# Patient Record
Sex: Male | Born: 1960 | Race: White | Hispanic: No | Marital: Married | Smoking: Never smoker
Health system: Southern US, Community
[De-identification: ages and names within clinical notes are randomized; demographics above are authoritative.]

## PROBLEM LIST (undated history)

## (undated) HISTORY — PX: OTHER SURGICAL HISTORY: SHX169

---

## 2004-06-02 ENCOUNTER — Encounter: Payer: Self-pay | Admitting: Family Medicine

## 2004-06-02 LAB — CONVERTED CEMR LAB
AST: 26 units/L
CO2: 27 meq/L
Chloride: 101 meq/L
Potassium: 4.6 meq/L
Sodium: 136 meq/L

## 2004-06-05 ENCOUNTER — Encounter: Payer: Self-pay | Admitting: Family Medicine

## 2004-06-05 LAB — CONVERTED CEMR LAB
LDL Cholesterol: 92 mg/dL
Triglycerides: 53 mg/dL

## 2006-09-16 LAB — CONVERTED CEMR LAB
ALT: 21 units/L
Albumin: 4.7 g/dL
Alkaline Phosphatase: 68 units/L
CO2: 20 meq/L
Calcium: 9.2 mg/dL
Chloride: 102 meq/L
Cholesterol: 193 mg/dL
Glucose, Bld: 95 mg/dL
Platelets: 194 10*3/uL
Total Bilirubin: 0.7 mg/dL
Total Protein: 7.3 g/dL
Triglycerides: 98 mg/dL
WBC, blood: 5.3 10*3/uL

## 2007-07-16 ENCOUNTER — Ambulatory Visit: Payer: Self-pay | Admitting: Family Medicine

## 2007-07-22 ENCOUNTER — Encounter: Payer: Self-pay | Admitting: Family Medicine

## 2007-07-28 ENCOUNTER — Encounter: Payer: Self-pay | Admitting: Family Medicine

## 2007-09-02 ENCOUNTER — Encounter: Payer: Self-pay | Admitting: Family Medicine

## 2007-09-02 LAB — CONVERTED CEMR LAB
ALT: 18 units/L
AST: 19 units/L
Alkaline Phosphatase: 64 units/L
CO2: 22 meq/L
Calcium: 9 mg/dL
Cholesterol: 175 mg/dL
HCT: 47.2 %
HDL: 62 mg/dL
Platelets: 189 10*3/uL
Sodium: 139 meq/L
TSH: 0.647 microintl units/mL
Total Protein: 6.5 g/dL
WBC, blood: 5.2 10*3/uL

## 2007-12-04 ENCOUNTER — Ambulatory Visit: Payer: Self-pay | Admitting: Family Medicine

## 2007-12-15 ENCOUNTER — Encounter: Payer: Self-pay | Admitting: Family Medicine

## 2008-09-10 ENCOUNTER — Ambulatory Visit: Payer: Self-pay | Admitting: Internal Medicine

## 2008-09-10 LAB — CONVERTED CEMR LAB
Bilirubin Urine: NEGATIVE
Blood in Urine, dipstick: NEGATIVE
Nitrite: NEGATIVE
Protein, U semiquant: NEGATIVE
pH: 6

## 2009-09-20 ENCOUNTER — Encounter: Payer: Self-pay | Admitting: Family Medicine

## 2009-10-26 ENCOUNTER — Encounter: Payer: Self-pay | Admitting: Family Medicine

## 2009-11-10 ENCOUNTER — Ambulatory Visit: Payer: Self-pay | Admitting: Family Medicine

## 2009-12-27 ENCOUNTER — Ambulatory Visit: Payer: Self-pay | Admitting: Family Medicine

## 2009-12-27 ENCOUNTER — Telehealth: Payer: Self-pay | Admitting: Family Medicine

## 2009-12-27 DIAGNOSIS — R5383 Other fatigue: Secondary | ICD-10-CM

## 2009-12-27 DIAGNOSIS — R5381 Other malaise: Secondary | ICD-10-CM

## 2009-12-28 ENCOUNTER — Encounter: Payer: Self-pay | Admitting: Family Medicine

## 2009-12-28 DIAGNOSIS — E291 Testicular hypofunction: Secondary | ICD-10-CM

## 2009-12-28 LAB — CONVERTED CEMR LAB: Sex Hormone Binding: 50 nmol/L (ref 13–71)

## 2009-12-29 ENCOUNTER — Encounter: Payer: Self-pay | Admitting: Family Medicine

## 2010-01-30 ENCOUNTER — Encounter: Payer: Self-pay | Admitting: Family Medicine

## 2010-01-30 LAB — CONVERTED CEMR LAB
AST: 56 units/L — ABNORMAL HIGH (ref 0–37)
Alkaline Phosphatase: 73 units/L (ref 39–117)
Indirect Bilirubin: 0.7 mg/dL (ref 0.0–0.9)
LDL Cholesterol: 71 mg/dL (ref 0–99)
PSA: 0.26 ng/mL (ref 0.10–4.00)
Total Bilirubin: 1 mg/dL (ref 0.3–1.2)
Total CHOL/HDL Ratio: 4.2
Triglycerides: 79 mg/dL (ref <150)
VLDL: 16 mg/dL (ref 0–40)

## 2010-02-01 LAB — CONVERTED CEMR LAB
ALT: 62 units/L — ABNORMAL HIGH (ref 0–53)
AST: 39 units/L — ABNORMAL HIGH (ref 0–37)
Albumin: 4.7 g/dL (ref 3.5–5.2)
Indirect Bilirubin: 0.8 mg/dL (ref 0.0–0.9)
Total Protein: 7.2 g/dL (ref 6.0–8.3)

## 2010-02-03 ENCOUNTER — Encounter
Admission: RE | Admit: 2010-02-03 | Discharge: 2010-02-03 | Payer: Self-pay | Source: Home / Self Care | Attending: Family Medicine | Admitting: Family Medicine

## 2010-02-16 ENCOUNTER — Encounter: Payer: Self-pay | Admitting: Family Medicine

## 2010-03-28 NOTE — Assessment & Plan Note (Signed)
Summary: Sinusitis   Vital Signs:  Patient profile:   50 year old male Height:      67 inches Weight:      158 pounds Temp:     100.4 degrees F oral Pulse rate:   83 / minute BP sitting:   145 / 84  (right arm) Cuff size:   regular  Vitals Entered By: Avon Gully CMA, Duncan Dull) (November 10, 2009 3:28 PM) CC: fever last friday 102, didnt run a fever again  untill today, HA hurts behind the eyes, drainage and scratchy throat   Primary Care Provider:  Seymour Bars DO  CC:  fever last friday 102, didnt run a fever again  untill today, HA hurts behind the eyes, and drainage and scratchy throat.  History of Present Illness: fever last friday 102, didn;t run a fever again  untill today, HA hurts behind the eyes, sinus drainage and scratchy throat.   Started taking a sudafed. More congestion. Sinus pressure.  + HA yesterday. No GI sxs.  No ear pain. Pain in the frontal area and behind the eyes. Also using saline nasal spray.   Current Medications (verified): 1)  None  Allergies (verified): 1)  ! Pcn 2)  ! Sulfa  Comments:  Nurse/Medical Assistant: The patient's medications and allergies were reviewed with the patient and were updated in the Medication and Allergy Lists. 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   Never smoked. 4 ETOH/ week. Works out - cycles, runs 4 x a week.  Good diet (Heart Healthy).  Physical Exam  General:  Well-developed,well-nourished,in no acute distress; alert,appropriate and cooperative throughout examination Head:  Normocephalic and atraumatic without obvious abnormalities. No apparent alopecia or balding. Eyes:  No corneal or conjunctival inflammation noted. EOMI. Perrla.  Ears:  External ear exam shows no significant lesions or deformities.  Otoscopic examination reveals clear canals, tympanic membranes are intact bilaterally without bulging,  retraction, inflammation or discharge. Hearing is grossly normal bilaterally. Nose:  External nasal examination shows no deformity or inflammation. Nasal mucosa are pink and moist without lesions or exudates. Mouth:  Oral mucosa and oropharynx without lesions or exudates.  Teeth in good repair. Neck:  No deformities, masses, or tenderness noted. Lungs:  Normal respiratory effort, chest expands symmetrically. Lungs are clear to auscultation, no crackles or wheezes. Heart:  Normal rate and regular rhythm. S1 and S2 normal without gallop, murmur, click, rub or other extra sounds. Skin:  no rashes.   Cervical Nodes:  No lymphadenopathy noted Psych:  Cognition and judgment appear intact. Alert and cooperative with normal attention span and concentration. No apparent delusions, illusions, hallucinations   Impression & Recommendations:  Problem # 1:  SINUSITIS - ACUTE-NOS (ICD-461.9) Since started with fever and now fever again 6 days later will tx as bacterial sinusitis. Call if not better in one week. STay hdyrated.   Consider flu vac when better since has a fever today. Usually gets through work.  His updated medication list for this problem includes:    Zithromax Z-pak 250 Mg Tabs (Azithromycin) .[name] Kitchen... Take as directed.  Instructed on treatment. Call if symptoms persist or worsen.   Complete Medication List: 1)  Zithromax Z-pak 250 Mg Tabs (Azithromycin) .... Take as directed.  Contraindications/Deferment of Procedures/Staging:    Treatment: Flu Shot    Contraindication: other   Patient Instructions: 1)  Call if not better in one  week.  Prescriptions: ZITHROMAX Z-PAK 250 MG TABS (AZITHROMYCIN) Take as directed.  #1 pack x 0   Entered and Authorized by:   Nani Gasser MD   Signed by:   Nani Gasser MD on 11/10/2009   Method used:   Electronically to        [facility] [phone]* (retail)       [address]       [place], [place]  [address]       Ph: [phone] or  [phone]       Fax: [phone]   RxID:   [id number]

## 2010-03-28 NOTE — Letter (Signed)
Summary: ED Questionnaire  ED Questionnaire   Imported By: Lanelle Bal 01/04/2010 11:03:35  _____________________________________________________________________  External Attachment:    Type:   Image     Comment:   External Document

## 2010-03-28 NOTE — Progress Notes (Signed)
Summary: testerone  ---- Converted from flag ---- ---- 12/27/2009 11:06 AM, Nani Gasser MD wrote: Call pt: Could consider testing testosterone levels if would like. ------------------------------  12/27/09 2:50 acm spoke with pt and pt does want to do the testosterone test  November  1, 20113:12 PM OK, can fax rx downstairs. [place]he[place] MD, [facility]   December 27, 2009 3:23 PM McCrimmon CMA, Duncan Dull), Sue Lush

## 2010-03-28 NOTE — Assessment & Plan Note (Signed)
Summary: Fatigue   Vital Signs:  Patient profile:   50 year old male Height:      67 inches Weight:      157 pounds Temp:     98.2 degrees F oral Pulse rate:   67 / minute BP sitting:   130 / 79  (right arm) Cuff size:   regular  Vitals Entered By: Avon Gully CMA, Duncan Dull) (December 27, 2009 9:23 AM) CC: feeling fatigue,wants flu shot, had physical at work but wants to know if there is anything eles that should be added to bld work   Primary Care Provider:  Seymour Bars DO  CC:  feeling fatigue, wants flu shot, and had physical at work but wants to know if there is anything eles that should be added to bld work.  History of Present Illness: feeling fatigue,wants flu shot, had physical at work but wants to know if there is anything else that should be added to bld work. Does have a new baby at home that is one 31 old and an 41 month old. 1 mo old has been colicky and waking up in the middle of the night. He mostly notices with his cycling dec energy adn stamina. Says he knows age may play a factor as well. Says gets good amt of protein in his diet. 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 Had normal CBC, thyroid, PSA, adn CMP.  LDL was borderline. Normal PFTs.  No other specific sxs.   Current Medications (verified): 1)  None  Allergies (verified): 1)  ! Pcn 2)  ! Sulfa  Comments:  Nurse/Medical Assistant: The patient's medications and allergies were reviewed with the patient and were updated in the Medication and Allergy Lists. 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   Married in 2008 to [place].   Never smoked. 4 ETOH/ week. Works out - cycles, runs 4 x a week.  Good diet (Heart  Healthy).  Physical Exam  General:  Well-developed,well-nourished,in no acute distress; alert,appropriate and cooperative throughout examination Head:  Normocephalic and atraumatic without obvious abnormalities. No apparent alopecia or balding. Neck:  No deformities, masses, or tenderness noted. NO TM.  Lungs:  Normal respiratory effort, chest expands symmetrically. Lungs are clear to auscultation, no crackles or wheezes. Heart:  Normal rate and regular rhythm. S1 and S2 normal without gallop, murmur, click, rub or other extra sounds. Skin:  no rashes.   Cervical Nodes:  No lymphadenopathy noted   Impression & Recommendations:  Problem # 1:  FATIGUE (ICD-780.79) His labs are essentailly normal and suggest that the change he has noticed may be from lack of sleep with the new baby and inc stress levels of now having 2 children in the home.  Could consider testing his testosterone levels.  Recommmend see if he notices improvement over the next 3-4 months as his little ones are hopefull sleeping throught the night. If not then consider further workup.   Patient Instructions: 1)  See if stamina duing workout gets better over the next 3-4 months. If not then let me know.    Orders Added: 1)  Est. Patient Level III [16109]  Appended Document: Fatigue    Clinical Lists Changes  Orders: Added new Service order of Flu Vaccine 85yrs + [phone]) - Signed Added new Service order of Admin 1st Vaccine ([id number]) - Signed Observations: Added new observation of FLU VAX#1VIS: 09/20/09 version given December 27, 2009. (12/27/2009 11:12) Added new observation of FLU VAXLOT: [id number] (12/27/2009 11:12) Added new observation of FLU VAX EXP: 08/26/2010 (12/27/2009 11:12) Added new observation of FLU VAXBY: Andrea McCrimmon CMA, (AAMA) (12/27/2009 11:12) Added new observation of FLU VAXRTE: IM (12/27/2009 11:12) Added new observation of FLU VAX DSE: 0.5 ml (12/27/2009 11:12) Added new observation of FLU  VAXMFR: GlaxoSmithKline (12/27/2009 11:12) Added new observation of FLU VAX SITE: left deltoid (12/27/2009 11:12) Added new observation of FLU VAX: Fluvax 3+ (12/27/2009 11:12)       Immunizations Administered:  Influenza Vaccine # 1:    Vaccine Type: Fluvax 3+    Site: left deltoid    Mfr: GlaxoSmithKline    Dose: 0.5 ml    Route: IM    Given by: Sue Lush McCrimmon CMA, (AAMA)    Exp. Date: 08/26/2010    Lot #: [id number]    VIS given: 09/20/09 version given December 27, 2009.  Flu Vaccine Consent Questions:    Do you have a history of severe allergic reactions to this vaccine? no    Any prior history of allergic reactions to egg and/or gelatin? no    Do you have a sensitivity to the preservative Thimersol? no    Do you have a past history of Guillan-Barre Syndrome? no    Do you currently have an acute febrile illness? no    Have you ever had a severe reaction to latex? no    Vaccine information given and explained to patient? no

## 2010-03-30 NOTE — Consult Note (Signed)
Summary: [facility]   Imported By: Lanelle Bal 03/06/2010 11:37:02  _____________________________________________________________________  External Attachment:    Type:   Image     Comment:   External Document

## 2010-04-10 ENCOUNTER — Ambulatory Visit (INDEPENDENT_AMBULATORY_CARE_PROVIDER_SITE_OTHER): Payer: Commercial Indemnity | Admitting: Family Medicine

## 2010-04-10 ENCOUNTER — Encounter: Payer: Self-pay | Admitting: Family Medicine

## 2010-04-10 DIAGNOSIS — B354 Tinea corporis: Secondary | ICD-10-CM | POA: Insufficient documentation

## 2010-04-11 ENCOUNTER — Encounter: Payer: Self-pay | Admitting: Family Medicine

## 2010-04-19 NOTE — Assessment & Plan Note (Signed)
Summary: RAsh on neck   Vital Signs:  Patient profile:   50 year old male Height:      67 inches Weight:      155 pounds Pulse rate:   89 / minute BP sitting:   136 / 85  (right arm) Cuff size:   regular  Vitals Entered By: Avon Gully CMA, Duncan Dull) (April 10, 2010 8:27 AM) CC: rash on neck,form   Primary Care Provider:  Seymour Bars DO  CC:  rash on neck and form.  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  She is already certified but wants to do some addition training  Also getting small flat dry circular areas on his neck.Had a lesion on the Left forearm as well. He has been using lamisil on it and that has been helping but then will get a new spot. Changed razors adn tried cleaning his helmut well.   Current Medications (verified): 1)  None  Allergies (verified): 1)  ! Pcn 2)  ! Sulfa  Comments:  Nurse/Medical Assistant: The patient's medications and allergies were reviewed with the patient and were updated in the Medication and Allergy Lists. Avon Gully CMA, Duncan Dull) (April 10, 2010 8:28 AM)  Social History: Reviewed history from 11/10/2009 and no changes required. 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   Never smoked. 4 ETOH/ week. Works out - cycles, runs 4 x a week.  Good diet (Heart Healthy).  Physical Exam  General:  Well-developed,well-nourished,in no acute distress; alert,appropriate and cooperative throughout examination Skin:  Neck with 3 small 0.5 cm flat dry scaling pink lesions.     Impression & Recommendations:  Problem # 1:  TINEA CORPORIS (ICD-110.5) Since lamisil is working well discussed using the cream for one more week after the lesions resolves to make sure it treats any spores left on the skin.  Likely this should work. Also clean things that come in contact with the face with a bleach solution to kill the  spores.   Also note form was completed. OK for diving.    Orders Added: 1)  Est. Patient Level II [11914]

## 2010-04-25 ENCOUNTER — Ambulatory Visit (INDEPENDENT_AMBULATORY_CARE_PROVIDER_SITE_OTHER): Payer: Commercial Indemnity | Admitting: Family Medicine

## 2010-04-25 ENCOUNTER — Encounter: Payer: Self-pay | Admitting: Family Medicine

## 2010-04-25 DIAGNOSIS — R0789 Other chest pain: Secondary | ICD-10-CM | POA: Insufficient documentation

## 2010-05-04 NOTE — Assessment & Plan Note (Signed)
Summary: F/U Hospitalization for Atypical CP   Vital Signs:  Patient profile:   50 year old male Height:      67 inches Weight:      154 pounds Pulse rate:   69 / minute BP sitting:   121 / 77  (right arm) Cuff size:   regular  Vitals Entered By: Avon Gully CMA, Duncan Dull) (April 25, 2010 8:55 AM) CC: hosptital f/u chest pain   Primary Care Provider:  Seymour Bars DO  CC:  hosptital f/u chest pain.  History of Present Illness: Presented to the ED 2-13 with CP. Was driving home from dinner with his wife. Squeezing pain with pressure.  No chest wall discomfort. Started after a "fullness" sensation. CP was in the center of chest. Normal tropinins, normal EKG.  Had normal stress test and echo the following day. CP resolved after about 20-30 minutes. They felt not heart related.  No f/u with cardiology. Back at work and no more episodes of CP.  No family hx of heart dz.   Current Medications (verified): 1)  None  Allergies (verified): 1)  ! Pcn 2)  ! Sulfa  Comments:  Nurse/Medical Assistant: The patient's medications and allergies were reviewed with the patient and were updated in the Medication and Allergy Lists. Avon Gully CMA, Duncan Dull) (April 25, 2010 8:58 AM)  Past History:  Past Medical History: Headaches  in childhood, last headache 2000; no definite diagnsis of migraine Chest wall pain , negative Stress Cardiolite 2006 Neg stress test in 03/2010  Physical Exam  General:  Well-developed,well-nourished,in no acute distress; alert,appropriate and cooperative throughout examination Neck:  No deformities, masses, or tenderness noted. Lungs:  Normal respiratory effort, chest expands symmetrically. Lungs are clear to auscultation, no crackles or wheezes. Heart:  Normal rate and regular rhythm. S1 and S2 normal without gallop, murmur, click, rub or other extra sounds. NO carotid or abdominal bruits.   Abdomen:  Bowel sounds positive,abdomen soft and non-tender  without masses, organomegaly or hernias noted. Pulses:  Radila 2+ bilat.  Extremities:  No LE edema.    Impression & Recommendations:  Problem # 1:  CHEST PAIN, ATYPICAL (ICD-786.59) Discussed that he is doing well and had a normal stress test. Cholesterol is well controlled. He is not a smoker and no premature family hx of heart dz. He is low risk.  If has any more sxs then please call and will work up further for GI sxs.    Patient Instructions: 1)  Please call us if start having chest pain.    Orders Added: 1)  Est. Patient Level III [16109]

## 2010-05-04 NOTE — Letter (Signed)
Summary: [facility]   Imported By: Lanelle Bal 04/27/2010 13:17:39  _____________________________________________________________________  External Attachment:    Type:   Image     Comment:   External Document

## 2010-06-29 ENCOUNTER — Ambulatory Visit (INDEPENDENT_AMBULATORY_CARE_PROVIDER_SITE_OTHER): Payer: Commercial Indemnity | Admitting: Family Medicine

## 2010-06-29 VITALS — BP 130/77 | HR 73 | Ht 67.0 in | Wt 154.0 lb

## 2010-06-29 DIAGNOSIS — J40 Bronchitis, not specified as acute or chronic: Secondary | ICD-10-CM

## 2010-06-29 MED ORDER — AZITHROMYCIN 250 MG PO TABS: ORAL_TABLET | ORAL | Status: AC

## 2010-06-29 NOTE — Patient Instructions (Signed)
Can you your OTC cough suppressants. If you get worse then can fill the antibiotic.

## 2010-06-29 NOTE — Progress Notes (Signed)
  Subjective:    Patient ID: Scott Ramirez, male    DOB: 1960-10-04, 50 y.o.   MRN: [id number]  Cough This is a chronic problem. The current episode started 1 to 4 weeks ago (10 days). The problem has been unchanged. The problem occurs hourly. The cough is productive of purulent sputum (yellow). The symptoms are aggravated by nothing (worse at night). He has tried OTC cough suppressant (NSAIDs) for the symptoms. The treatment provided mild relief. His past medical history is significant for environmental allergies. There is no history of asthma.  No SOB with physical activity. Low grades temps the first week. Have now resolved.     Review of Systems  Respiratory: Positive for cough.   Hematological: Positive for environmental allergies.       Objective:   Physical Exam  Constitutional: He appears well-developed and well-nourished.  HENT:  Head: Normocephalic.  Right Ear: External ear normal.  Left Ear: External ear normal.  Nose: Nose normal.  Mouth/Throat: Oropharynx is clear and moist.       Tip of uvula is erythematous  Eyes: Conjunctivae are normal. Pupils are equal, round, and reactive to light.  Neck: No thyromegaly present.  Cardiovascular: Normal rate, regular rhythm and normal heart sounds.   Pulmonary/Chest: Effort normal. No respiratory distress. He has no wheezes. He has no rales.       Rhonchi on the left lower lung.   Lymphadenopathy:    He has no cervical adenopathy.          Assessment & Plan:  Acute bronchitis - Likely viral. Gave reassurance. OTC cough meds are ok.  If suddenly gets worse then can start the antibiotic. Pt understands and agrees to care plan.

## 2010-06-30 ENCOUNTER — Encounter: Payer: Self-pay | Admitting: Family Medicine

## 2011-03-01 ENCOUNTER — Ambulatory Visit (INDEPENDENT_AMBULATORY_CARE_PROVIDER_SITE_OTHER): Payer: Commercial Indemnity | Admitting: Physician Assistant

## 2011-03-01 ENCOUNTER — Encounter: Payer: Self-pay | Admitting: Physician Assistant

## 2011-03-01 VITALS — BP 148/85 | HR 90 | Temp 97.5°F | Ht 67.0 in | Wt 158.0 lb

## 2011-03-01 DIAGNOSIS — C44611 Basal cell carcinoma of skin of unspecified upper limb, including shoulder: Secondary | ICD-10-CM

## 2011-03-01 DIAGNOSIS — Z23 Encounter for immunization: Secondary | ICD-10-CM

## 2011-03-01 NOTE — Patient Instructions (Addendum)
No treatment necessary for treated area. Do not cover. May blister do not open blister.Encourage yearly skin exam and daily use of sunscreen. F/u if any more spots start to show up.

## 2011-03-01 NOTE — Progress Notes (Signed)
  Subjective:    Patient ID: Scott Ramirez, male    DOB: 06/13/1960, 51 y.o.   MRN: [id number]  HPI Patient presents with a spot on his left forearm that has been present for 5-6 months since the summer. He is a biker but tries to wear long sleeves or sunscreen. He did grow up in [place] with an extensive sun exposure history.   Spot does not burn, hurt, bleed, or itch. When he first noticed the spot it was scaly but it used lotion on it and the scales when away. He has no history of skin cancer.   Review of Systems  Constitutional: Negative for fever, chills, diaphoresis and fatigue.  Skin: Positive for color change. Negative for pallor, rash and wound.       Raised red spot on arm that has not gone away.       Objective:   Physical Exam  Constitutional: He is oriented to person, place, and time. He appears well-developed and well-nourished.  Neurological: He is alert and oriented to person, place, and time.  Skin: Skin is warm and dry.       3mm raised pearly papule on left forearm.  Psychiatric: He has a normal mood and affect. His behavior is normal.          Assessment & Plan:  Basal cell carcinoma- Cryotherapy was done on papule. Patient was given instructions on care. Encourage to have a full body skin check every year for other lesions due to extensive sun exposure. Cryotherapy Procedure Note  Pre-operative Diagnosis: Basal cell carcinoma  Post-operative Diagnosis: Basal cell carcinoma  Locations: left forearm  Indications: skin cancer  Anesthesia: not required   Procedure Details  History of allergy to iodine: no. Pacemaker? no.  Patient informed of risks (permanent scarring, infection, light or dark discoloration, bleeding, infection, weakness, numbness and recurrence of the lesion) and benefits of the procedure and verbal informed consent obtained.  The areas are treated with liquid nitrogen therapy, frozen until ice ball extended 2 mm beyond lesion,  allowed to thaw, and treated again. The patient tolerated procedure well.  The patient was instructed on post-op care, warned that there may be blister formation, redness and pain. Recommend OTC analgesia as needed for pain.  Condition: Stable  Complications: none.  Plan: 1. Instructed to keep the area dry and covered for 24-48h and clean thereafter. 2. Warning signs of infection were reviewed.   3. Recommended that the patient use OTC acetaminophen as needed for pain.  4. Return in 2 week if complications or lesion not gone.  Tdap given.

## 2011-03-08 ENCOUNTER — Encounter: Payer: Self-pay | Admitting: Family Medicine

## 2011-03-08 ENCOUNTER — Ambulatory Visit (INDEPENDENT_AMBULATORY_CARE_PROVIDER_SITE_OTHER): Payer: Commercial Indemnity | Admitting: Family Medicine

## 2011-03-08 VITALS — BP 141/82 | HR 106 | Ht 67.0 in | Wt 156.0 lb

## 2011-03-08 DIAGNOSIS — R21 Rash and other nonspecific skin eruption: Secondary | ICD-10-CM

## 2011-03-08 DIAGNOSIS — B029 Zoster without complications: Secondary | ICD-10-CM

## 2011-03-08 NOTE — Progress Notes (Signed)
  Subjective:    Patient ID: Scott Ramirez, male    DOB: 07/07/1960, 51 y.o.   MRN: [id number]  HPI Rash started about 6 days ago. Tried IBU and cortisone cream. Mildy itchey and tender. It is on the right side of the abdomen over the dermatome stretching onto his back.  Never had before. Review of Systems     Objective:   Physical Exam  Papulovesicular rash on right side abdomen radiating to the right flank. Several appear to be scabbing over.      Assessment & Plan:  Rash - Based on exam likely shingles. Viral culture sent. He declined antiviral or pain treatment.

## 2011-03-13 LAB — VARICELLA-ZOSTER BY PCR: VZV DNA, QL PCR: DETECTED — AB

## 2011-05-03 LAB — HM COLONOSCOPY

## 2011-05-08 ENCOUNTER — Encounter: Payer: Self-pay | Admitting: *Deleted

## 2011-05-11 ENCOUNTER — Encounter: Payer: Self-pay | Admitting: *Deleted

## 2011-11-05 ENCOUNTER — Ambulatory Visit (HOSPITAL_BASED_OUTPATIENT_CLINIC_OR_DEPARTMENT_OTHER)
Admission: RE | Admit: 2011-11-05 | Discharge: 2011-11-05 | Disposition: A | Discharge status: Home / Self Care | Payer: Commercial Indemnity | Source: Ambulatory Visit | Attending: Occupational Medicine | Admitting: Occupational Medicine

## 2011-11-05 ENCOUNTER — Other Ambulatory Visit: Payer: Self-pay | Admitting: Occupational Medicine

## 2011-11-05 DIAGNOSIS — Z0000 Encounter for general adult medical examination without abnormal findings: Secondary | ICD-10-CM

## 2011-11-13 ENCOUNTER — Encounter: Payer: Self-pay | Admitting: Family Medicine

## 2011-11-13 ENCOUNTER — Ambulatory Visit (INDEPENDENT_AMBULATORY_CARE_PROVIDER_SITE_OTHER): Payer: Commercial Indemnity | Admitting: Family Medicine

## 2011-11-13 VITALS — BP 129/81 | HR 78 | Wt 153.0 lb

## 2011-11-13 DIAGNOSIS — R030 Elevated blood-pressure reading, without diagnosis of hypertension: Secondary | ICD-10-CM

## 2011-11-13 NOTE — Progress Notes (Signed)
  Subjective:    Patient ID: Scott Ramirez, male    DOB: 1960/12/15, 51 y.o.   MRN: [id number]  HPI Followup elevated blood pressure. 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 They did blood work which he reports was all normal. They also did an EKG. He was told that there was some borderline signs of left ventricular hypertrophy. The most important his blood pressure was 160. Repeat blood pressure was 158 said he was able to pass. He normally does not have a history of high blood pressures. Denies any chest pain, shortness of breath, dizziness, palpitations, swelling. He has not been on blood pressure medication. He denies taking Aleve or ibuprofen yesterday. He denies any caffeine intake yesterday. He did take Aleve 2 days ago. He says that he may have been high salt meal the night before.   Review of Systems     Objective:   Physical Exam  Constitutional: He is oriented to person, place, and time. He appears well-developed and well-nourished.  HENT:  Head: Normocephalic and atraumatic.  Cardiovascular: Normal rate, regular rhythm and normal heart sounds.   Pulmonary/Chest: Effort normal and breath sounds normal.  Musculoskeletal: He exhibits no edema.  Neurological: He is alert and oriented to person, place, and time.  Skin: Skin is warm and dry.  Psychiatric: He has a normal mood and affect. His behavior is normal.          Assessment & Plan:  Elevated blood pressure-his blood pressure looks fantastic today. Continue current regimen. He says he will follow at work and have one of the guys at work check it for him frequently to keep an eye on it. Right now we went over potential secondary causes such as medications and high salt intake. He will keep an eye on this. Call if any palms or concerns. He reports they also did very thorough lab work. They will hopefully send a copy to me secondly sure that his kidney function thyroid are normal.  He will get  his flu shot at work.

## 2013-03-04 IMAGING — CR DG CHEST 1V
1 series · 1 of 1 positions shown · non-contrast
Comparison: None.

CLINICAL DATA: Annual physical.

CHEST - 1 VIEW

[w chest pa]
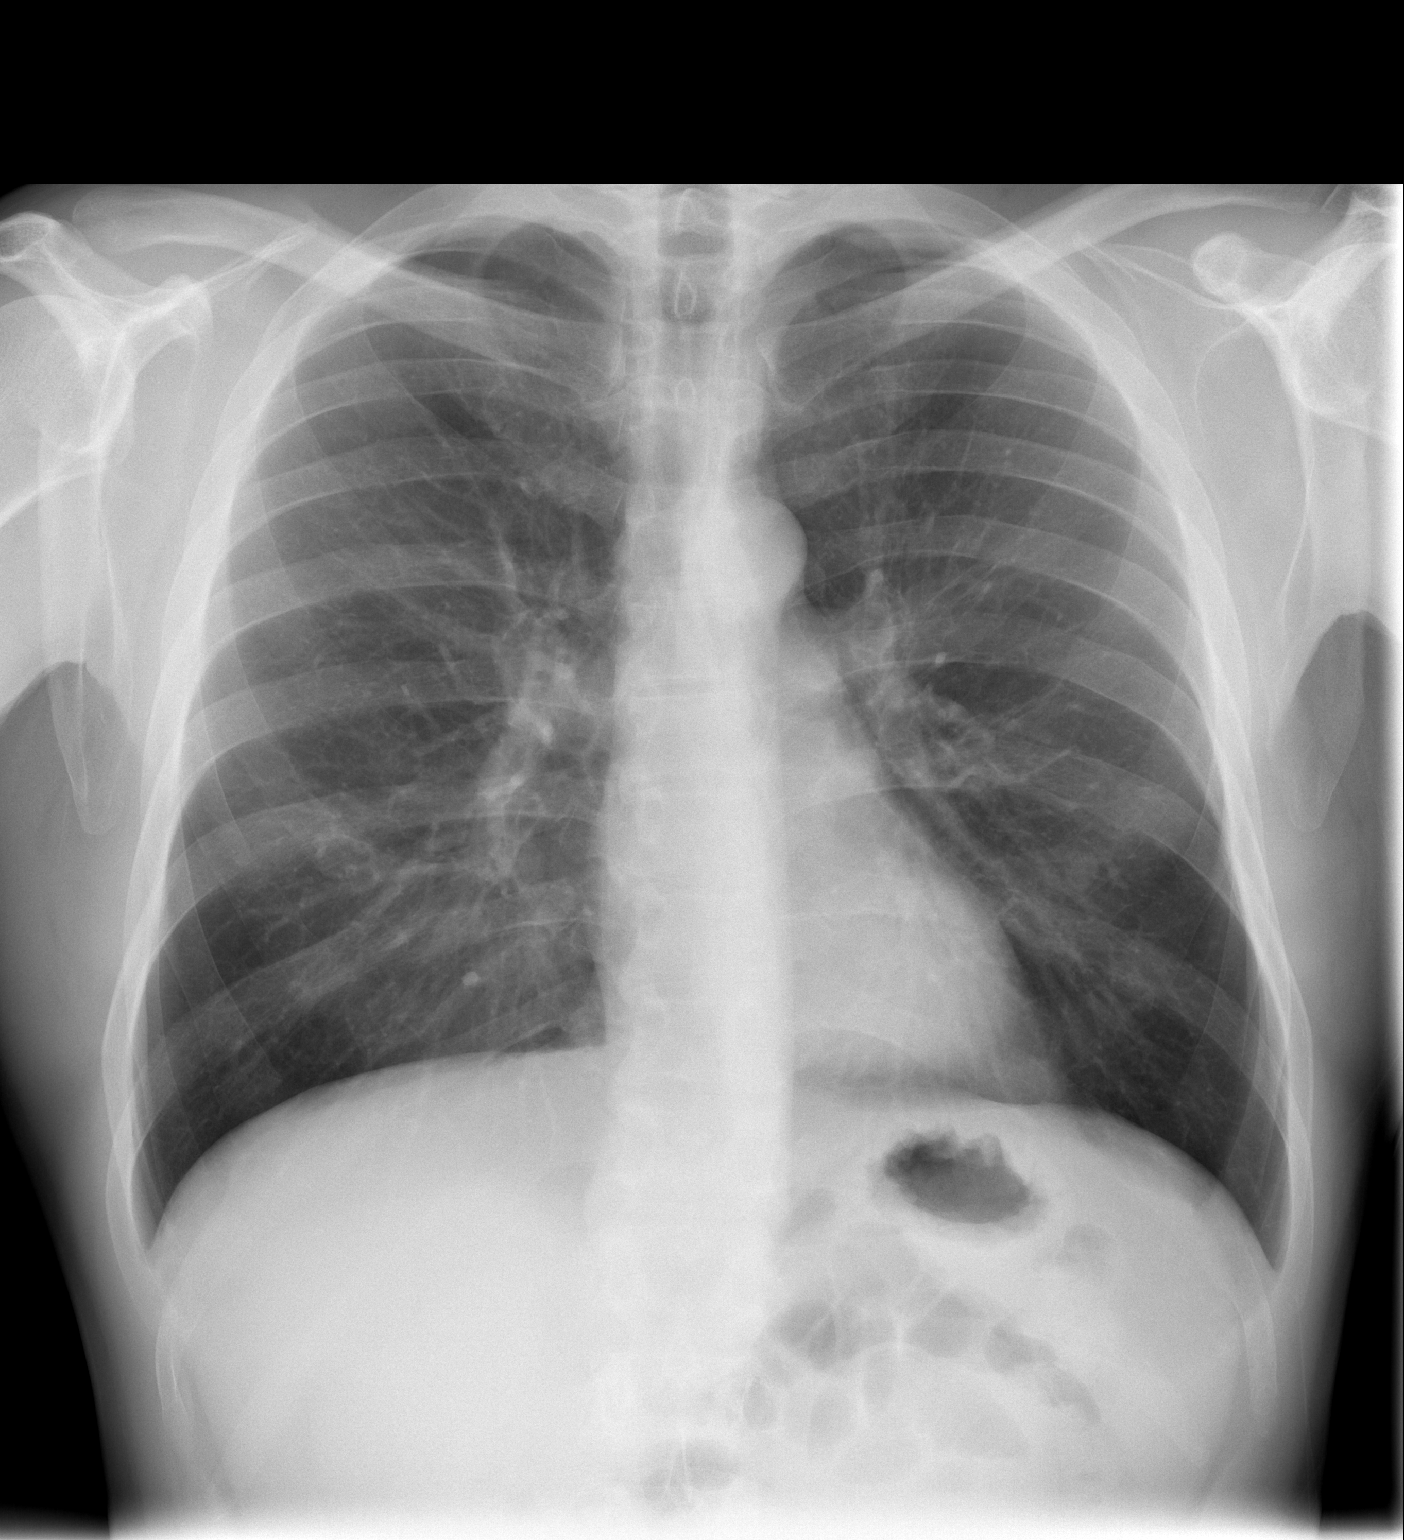

[1 of 1 positions shown; findings below may reference images not displayed]

FINDINGS: The heart size and mediastinal contours are within normal
limits.  Both lungs are clear.
IMPRESSION: No active disease.

## 2013-06-16 ENCOUNTER — Encounter: Payer: Self-pay | Admitting: Family Medicine

## 2013-06-16 ENCOUNTER — Ambulatory Visit (INDEPENDENT_AMBULATORY_CARE_PROVIDER_SITE_OTHER): Payer: Commercial Indemnity | Admitting: Family Medicine

## 2013-06-16 VITALS — BP 139/83 | HR 64 | Ht 68.0 in | Wt 154.0 lb

## 2013-06-16 DIAGNOSIS — Z0000 Encounter for general adult medical examination without abnormal findings: Secondary | ICD-10-CM

## 2013-06-16 NOTE — Progress Notes (Signed)
   Subjective:    Patient ID: Scott Ramirez, male    DOB: [birth year], 53 y.o.   MRN: [id number]  HPI Here for CPE today.  No specific complaints. Still exercising fairly regularly. Feels like he eats a healthy diet.   Review of Systems Comprehensive review of systems is negative. Family history updated as below. complete physical examination  BP 139/83  Pulse 64  Ht 5\' 8"  (1.727 m)  Wt 154 lb (69.854 kg)  BMI 23.42 kg/m2    Allergies  Allergen Reactions  . Penicillins     hives  . Sulfonamide Derivatives     hives    No past medical history on file.  Past Surgical History  Procedure Laterality Date  . Bone cyst removed      right femur fracture.    History   Social History  . Marital Status: Married    Spouse Name: Ailene Ravel    Number of Children: 2  . 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 Smoking status: Never Smoker   . Smokeless tobacco: Not on file  . Alcohol Use: 2.0 oz/week    4 drink(s) per week  . Drug Use: No  . Sexual Activity: Yes    Partners: Female   Other Topics Concern  . Not on file   Social History Narrative   Runs and cycles 4 x a week.     Family History  Problem Relation Age of Onset  . Heart failure Mother   . Rheum arthritis Mother     No outpatient encounter prescriptions on file as of 06/16/2013.           Objective:   Physical Exam  Constitutional: He is oriented to person, place, and time. He appears well-developed and well-nourished.  HENT:  Head: Normocephalic and atraumatic.  Right Ear: External ear normal.  Left Ear: External ear normal.  Nose: Nose normal.  Mouth/Throat: Oropharynx is clear and moist.  Eyes: Conjunctivae and EOM are normal. Pupils are equal, round, and reactive to light.  Neck: Normal range of motion. Neck supple. No thyromegaly present.  Cardiovascular: Normal rate, regular rhythm, normal heart sounds and  intact distal pulses.   Pulmonary/Chest: Effort normal and breath sounds normal.  Abdominal: Soft. Bowel sounds are normal. He exhibits no distension and no mass. There is no tenderness. There is no rebound and no guarding.  Musculoskeletal: Normal range of motion.  Lymphadenopathy:    He has no cervical adenopathy.  Neurological: He is alert and oriented to person, place, and time. He has normal reflexes.  Skin: Skin is warm and dry.  Psychiatric: He has a normal mood and affect. His behavior is normal. Judgment and thought content normal.          Assessment & Plan:  CPE-  Keep up a regular exercise program and make sure you are eating a healthy diet Try to eat 4 servings of dairy a day, or if you are lactose intolerant take a calcium with vitamin D daily.  Your vaccines are up to date.  Prostate cancer screening discussed. Patient opts for rectal exam and PSA. Due for CMP and lipid panel.

## 2013-06-16 NOTE — Patient Instructions (Signed)
Keep up a regular exercise program and make sure you are eating a healthy diet Try to eat 4 servings of dairy a day, or if you are lactose intolerant take a calcium with vitamin D daily.  Your vaccines are up to date.   

## 2013-06-17 LAB — COMPLETE METABOLIC PANEL WITH GFR
ALBUMIN: 4.7 g/dL (ref 3.5–5.2)
ALT: 16 U/L (ref 0–53)
AST: 22 U/L (ref 0–37)
Alkaline Phosphatase: 59 U/L (ref 39–117)
BUN: 14 mg/dL (ref 6–23)
CALCIUM: 9.2 mg/dL (ref 8.4–10.5)
CHLORIDE: 102 meq/L (ref 96–112)
CO2: 26 mEq/L (ref 19–32)
CREATININE: 0.96 mg/dL (ref 0.50–1.35)
Glucose, Bld: 93 mg/dL (ref 70–99)
POTASSIUM: 4.1 meq/L (ref 3.5–5.3)
Sodium: 140 mEq/L (ref 135–145)
TOTAL PROTEIN: 7 g/dL (ref 6.0–8.3)
Total Bilirubin: 1.2 mg/dL (ref 0.2–1.2)

## 2013-06-17 LAB — LIPID PANEL
Cholesterol: 203 mg/dL — ABNORMAL HIGH (ref 0–200)
HDL: 54 mg/dL (ref >39)
LDL CALC: 139 mg/dL — AB (ref 0–99)
Total CHOL/HDL Ratio: 3.8 Ratio
Triglycerides: 51 mg/dL (ref <150)
VLDL: 10 mg/dL (ref 0–40)

## 2013-06-17 LAB — PSA: PSA: 0.56 ng/mL (ref <=4.00)

## 2013-12-28 ENCOUNTER — Ambulatory Visit (INDEPENDENT_AMBULATORY_CARE_PROVIDER_SITE_OTHER): Payer: Self-pay | Admitting: Family Medicine

## 2013-12-28 ENCOUNTER — Encounter: Payer: Self-pay | Admitting: Family Medicine

## 2013-12-28 VITALS — BP 130/90 | HR 82 | Ht 68.0 in | Wt 159.0 lb

## 2013-12-28 DIAGNOSIS — Z0289 Encounter for other administrative examinations: Secondary | ICD-10-CM

## 2013-12-28 DIAGNOSIS — Z0000 Encounter for general adult medical examination without abnormal findings: Secondary | ICD-10-CM

## 2013-12-28 NOTE — Progress Notes (Signed)
   Subjective:    Patient ID: Scott Ramirez, male    DOB: [birth year], 53 y.o.   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  He brought in a copy of his letter or he was evaluated by Dr. Robby Sermon. Also brought in a copy of his blood work including a CBC with differential, complete medical panel, lipid panel, PSA and thyroid level. These were all essentially normal. He also had a copy of his EKG. And spirometry. His spirometry was normal as well.   Review of Systems     Objective:   Physical Exam        Assessment & Plan:  Forms completed and signed. Copies of blood work EKG and spirometry scanned into the chart.

## 2013-12-31 ENCOUNTER — Encounter: Payer: Self-pay | Admitting: Family Medicine

## 2014-01-01 ENCOUNTER — Encounter: Payer: Self-pay | Admitting: Family Medicine

## 2015-04-28 ENCOUNTER — Encounter: Payer: Self-pay | Admitting: Family Medicine

## 2015-04-28 ENCOUNTER — Ambulatory Visit (INDEPENDENT_AMBULATORY_CARE_PROVIDER_SITE_OTHER): Payer: Commercial Indemnity | Admitting: Family Medicine

## 2015-04-28 VITALS — BP 123/69 | HR 91 | Temp 99.6°F | Wt 160.0 lb

## 2015-04-28 DIAGNOSIS — J020 Streptococcal pharyngitis: Secondary | ICD-10-CM

## 2015-04-28 DIAGNOSIS — J029 Acute pharyngitis, unspecified: Secondary | ICD-10-CM | POA: Diagnosis not present

## 2015-04-28 LAB — POCT RAPID STREP A (OFFICE): RAPID STREP A SCREEN: POSITIVE — AB

## 2015-04-28 MED ORDER — AZITHROMYCIN 250 MG PO TABS: ORAL_TABLET | ORAL | Status: AC

## 2015-04-28 MED ORDER — PENICILLIN G BENZATHINE 1200000 UNIT/2ML IM SUSP
600000.0000 [IU] | Freq: Once | INTRAMUSCULAR | Status: AC
  Administered 2015-04-28: 600000 [IU] via INTRAMUSCULAR

## 2015-04-28 NOTE — Addendum Note (Signed)
Addended by: Isaias Cowman C on: 04/28/2015 11:16 AM   Modules accepted: Orders

## 2015-04-28 NOTE — Patient Instructions (Signed)

## 2015-04-28 NOTE — Progress Notes (Signed)
   Subjective:    Patient ID: Scott Ramirez, male    DOB: 20-Sep-1960, 55 y.o.   MRN: [id number]  HPI Patient comes in with his son today and planning of pain extremely sore throat that started last night. Painful to swallow. No significant headache. He has run a temperature around 101. He's been using saltwater gargles and ibuprofen for pain and fever relief. No GI upset. His son is positive for strep throat as well.   iReview of Systems     Objective:   Physical Exam  Constitutional: He is oriented to person, place, and time. He appears well-developed and well-nourished.  HENT:  Head: Normocephalic and atraumatic.  Right Ear: External ear normal.  Left Ear: External ear normal.  Nose: Nose normal.  TMs and canals are clear. OP with significant erythema.   Eyes: Conjunctivae and EOM are normal. Pupils are equal, round, and reactive to light.  Neck: Neck supple. No thyromegaly present.  Cardiovascular: Normal rate and normal heart sounds.   Pulmonary/Chest: Effort normal and breath sounds normal.  Lymphadenopathy:    He has cervical adenopathy.  Neurological: He is alert and oriented to person, place, and time.  Skin: Skin is warm and dry.  Psychiatric: He has a normal mood and affect.          Assessment & Plan:  Strep pharyngitis-will tx with zpack since PCN allergy. Call if not better in one week. Symptomatic care. Contact precautions.

## 2016-04-09 ENCOUNTER — Encounter: Payer: Self-pay | Admitting: Physician Assistant

## 2016-04-09 ENCOUNTER — Ambulatory Visit (INDEPENDENT_AMBULATORY_CARE_PROVIDER_SITE_OTHER): Payer: Commercial Indemnity | Admitting: Physician Assistant

## 2016-04-09 VITALS — BP 125/83 | HR 83 | Temp 98.0°F | Resp 16 | Ht 67.0 in | Wt 160.0 lb

## 2016-04-09 DIAGNOSIS — L209 Atopic dermatitis, unspecified: Secondary | ICD-10-CM

## 2016-04-09 MED ORDER — TRIAMCINOLONE ACETONIDE 0.1 % EX CREA: 1.0000 "application " | TOPICAL_CREAM | Freq: Two times a day (BID) | CUTANEOUS | 0 refills | Status: DC

## 2016-04-09 NOTE — Progress Notes (Signed)
Subjective:     Patient ID: Scott Ramirez, male   DOB: 05/23/1960, 56 y.o.   MRN: [id number]  HPI  Pt is a 56 yo male who presents to the clinic with itchy rash over bilateral thighs for last month. Denies any pain. Seems to be worse after he showers. Used aquafor with little relief. He does wear boxer briefs and swims a lot. Denies any blisters or open lesions.   Review of Systems  All other systems reviewed and are negative.      Objective:   Physical Exam  Constitutional: He appears well-developed and well-nourished.  HENT:  Head: Normocephalic and atraumatic.  Cardiovascular: Normal rate, regular rhythm and normal heart sounds.   Pulmonary/Chest: Effort normal and breath sounds normal.  Skin:  Multiple Macular circular erythematous rash with no apparent central clearing or scales. No vesicles. Rash located over anterior/lateral thigh bilateral with some        Assessment:     Atopic dermatitis    Plan:     Appears like eczema. Cannot complete rule out tinea corporis. Since hot showers inflamed gave HO for other triggers. Discussed good moisturizer and to add triamcinolone bid. Follow up if rash is not improving or worsening.

## 2016-04-09 NOTE — Patient Instructions (Addendum)
   Contact Dermatitis Introduction Dermatitis is redness, soreness, and swelling (inflammation) of the skin. Contact dermatitis is a reaction to certain substances that touch the skin. You either touched something that irritated your skin, or you have allergies to something you touched. Follow these instructions at home: [place] your skin as needed.  Apply cool compresses to the affected areas.  Try taking a bath with:  Epsom salts. Follow the instructions on the package. You can get these at a pharmacy or grocery store.  Baking soda. Pour a small amount into the bath as told by your doctor.  Colloidal oatmeal. Follow the instructions on the package. You can get this at a pharmacy or grocery store.  Try applying baking soda paste to your skin. Stir water into baking soda until it looks like paste.  Do not scratch your skin.  Bathe less often.  Bathe in lukewarm water. Avoid using hot water. Medicines  Take or apply over-the-counter and prescription medicines only as told by your doctor.  If you were prescribed an antibiotic medicine, take or apply your antibiotic as told by your doctor. Do not stop taking the antibiotic even if your condition starts to get better. General instructions  Keep all follow-up visits as told by your doctor. This is important.  Avoid the substance that caused your reaction. If you do not know what caused it, keep a journal to try to track what caused it. Write down:  What you eat.  What cosmetic products you use.  What you drink.  What you wear in the affected area. This includes jewelry.  If you were given a bandage (dressing), take care of it as told by your doctor. This includes when to change and remove it. Contact a doctor if:  You do not get better with treatment.  Your condition gets worse.  You have signs of infection such as:  Swelling.  Tenderness.  Redness.  Soreness.  Warmth.  You have a fever.  You  have new symptoms. Get help right away if:  You have a very bad headache.  You have neck pain.  Your neck is stiff.  You throw up (vomit).  You feel very sleepy.  You see red streaks coming from the affected area.  Your bone or joint underneath the affected area becomes painful after the skin has healed.  The affected area turns darker.  You have trouble breathing. This information is not intended to replace advice given to you by your health care provider. Make sure you discuss any questions you have with your health care provider. Document Released: 12/10/2008 Document Revised: 07/21/2015 Document Reviewed: 06/30/2014  2017 Elsevier

## 2016-04-10 ENCOUNTER — Encounter: Payer: Self-pay | Admitting: Physician Assistant

## 2016-04-10 DIAGNOSIS — L209 Atopic dermatitis, unspecified: Secondary | ICD-10-CM | POA: Insufficient documentation

## 2018-10-06 LAB — HEPATIC FUNCTION PANEL
ALT: 20 (ref 10–40)
AST: 25 (ref 14–40)
Alkaline Phosphatase: 69 (ref 25–125)

## 2018-10-06 LAB — PSA: PSA: 0.5

## 2018-10-06 LAB — BASIC METABOLIC PANEL
BUN: 13 (ref 4–21)
CO2: 21 (ref 13–22)
Chloride: 102 (ref 99–108)
Creatinine: 1.1 (ref 0.6–1.3)
Glucose: 95
Potassium: 4.3 (ref 3.4–5.3)
Sodium: 138 (ref 137–147)

## 2018-10-06 LAB — CBC AND DIFFERENTIAL
HCT: 47 (ref 41–53)
Hemoglobin: 16.6 (ref 13.5–17.5)
Platelets: 211 (ref 150–399)
WBC: 5.3

## 2018-10-06 LAB — LIPID PANEL
Cholesterol: 187 (ref 0–200)
HDL: 63 (ref 35–70)
LDL Cholesterol: 113
Triglycerides: 56 (ref 40–160)

## 2018-10-06 LAB — TSH: TSH: 1.11 (ref 0.41–5.90)

## 2018-11-04 ENCOUNTER — Encounter: Payer: Self-pay | Admitting: Family Medicine

## 2018-11-28 ENCOUNTER — Ambulatory Visit (INDEPENDENT_AMBULATORY_CARE_PROVIDER_SITE_OTHER): Payer: Managed Care, Other (non HMO) | Admitting: Family Medicine

## 2018-11-28 ENCOUNTER — Other Ambulatory Visit: Payer: Self-pay

## 2018-11-28 ENCOUNTER — Encounter: Payer: Self-pay | Admitting: Family Medicine

## 2018-11-28 VITALS — BP 144/69 | HR 83 | Ht 67.0 in | Wt 154.0 lb

## 2018-11-28 DIAGNOSIS — R55 Syncope and collapse: Secondary | ICD-10-CM

## 2018-11-28 DIAGNOSIS — R42 Dizziness and giddiness: Secondary | ICD-10-CM

## 2018-11-28 DIAGNOSIS — Z23 Encounter for immunization: Secondary | ICD-10-CM | POA: Diagnosis not present

## 2018-11-28 NOTE — Progress Notes (Signed)
Acute Office Visit  Subjective:    Patient ID: Scott Ramirez, male    DOB: 1961/01/16, 58 y.o.   MRN: [id number]  Chief Complaint  Patient presents with  . Dizziness    HPI Patient is in today for episodes of feeling lightheaded.  They do not happen often but it is happened a few times and so he felt like he should come in just to make sure that everything is okay.  He says that one episode occurred while he was standing on the baseball field in the outfield when he suddenly felt like he was almost in a pass out.  It just lasted for couple seconds and then resolved he did not fall or lose consciousness.  He has had a couple other more minor episodes where he is bent over to pick something up and then stood back up quickly and felt a little lightheaded and noticed a few little stars.  He denies any chest pain shortness of breath sweating with these episodes.  He says that he does drink caffeine daily but actually recently has cut back on his caffeine intake.  He denies any ear pain or pressure.  He denies feeling like the room is spinning or that he is dizzy.  He also let me know that about a month ago he was sitting on the couch when he had an episode where he felt like his heart just skipped a beat.  Again just lasted a second or 2 and then resolved did not happen since then.  He does have a family history in his mother of congestive heart failure and rheumatoid arthritis but no MI or coronary artery disease.  No new medications or supplements.  No over-the-counter stimulants besides his caffeine intake from coffee.  He does take fish oil daily and recently started a powdered coconut.  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  All his labs look great except is mildly elevated LDL.  They also did an EKG which is normal and he brought a copy of that with him today so we will get those added and  scanned into the chart.  History reviewed. No pertinent past medical history.  Past Surgical History:  Procedure Laterality Date  . bone cyst removed     right femur fracture.    Family History  Problem Relation Age of Onset  . Heart failure Mother   . Rheum arthritis Mother     Social History   Socioeconomic History  . Marital status: Married    Spouse name: Ailene Ravel  . Number of children: 2  . Years of education: Not on file  . Highest education level: Not on file  Occupational History  . Occupation: Barista     Comment: [employer]  . Financial resource strain: Not on file  . Food insecurity    Worry: Not on file    Inability: Not on file  . Transportation needs    Medical: Not on file    Non-medical: Not on file  Tobacco Use  . Smoking status: Never Smoker  . Smokeless tobacco: Never Used  Substance and Sexual Activity  . Alcohol use: Yes    Alcohol/week: 4.0 standard drinks    Types: 4 Standard drinks or equivalent per week  . Drug use: No  . Sexual activity: Yes    Partners: Female  Lifestyle  .  Physical activity    Days per week: Not on file    Minutes per session: Not on file  . Stress: Not on file  Relationships  . Social Herbalist on phone: Not on file    Gets together: Not on file    Attends religious service: Not on file    Active member of club or organization: Not on file    Attends meetings of clubs or organizations: Not on file    Relationship status: Not on file  . Intimate partner violence    Fear of current or ex partner: Not on file    Emotionally abused: Not on file    Physically abused: Not on file    Forced sexual activity: Not on file  Other Topics Concern  . Not on file  Social History Narrative   Runs and cycles 4 x a week.     Outpatient Medications Prior to Visit  Medication Sig Dispense Refill  . Omega-3 Fatty Acids (FISH OIL PO) Take 1 capsule by mouth daily.    [name] Kitchen triamcinolone  cream (KENALOG) 0.1 % Apply 1 application topically 2 (two) times daily. 60 g 0   No facility-administered medications prior to visit.     Allergies  Allergen Reactions  . Penicillins     hives  . Sulfonamide Derivatives     hives    ROS     Objective:    Physical Exam  Constitutional: He is oriented to person, place, and time. He appears well-developed and well-nourished.  HENT:  Head: Normocephalic and atraumatic.  Cardiovascular: Normal rate, regular rhythm and normal heart sounds.  No carotid or abdominal or renal bruits  Pulmonary/Chest: Effort normal and breath sounds normal.  Neurological: He is alert and oriented to person, place, and time.  Skin: Skin is warm and dry.  Psychiatric: He has a normal mood and affect. His behavior is normal.    BP (!) 144/69   Pulse 83   Ht 5\' 7"  (1.702 m)   Wt 154 lb (69.9 kg)   SpO2 100%   BMI 24.12 kg/m  Wt Readings from Last 3 Encounters:  11/28/18 154 lb (69.9 kg)  04/09/16 160 lb (72.6 kg)  04/28/15 160 lb (72.6 kg)    There are no preventive care reminders to display for this patient.  There are no preventive care reminders to display for this patient.   Lab Results  Component Value Date   TSH 0.647 09/02/2007   Lab Results  Component Value Date   HGB 16.2 09/02/2007   HCT 45.3 12/29/2009   MCV 88 09/02/2007   PLT 189 09/02/2007   Lab Results  Component Value Date   NA 140 06/16/2013   K 4.1 06/16/2013   CO2 26 06/16/2013   GLUCOSE 93 06/16/2013   BUN 14 06/16/2013   CREATININE 0.96 06/16/2013   BILITOT 1.2 06/16/2013   ALKPHOS 59 06/16/2013   AST 22 06/16/2013   ALT 16 06/16/2013   PROT 7.0 06/16/2013   ALBUMIN 4.7 06/16/2013   CALCIUM 9.2 06/16/2013   Lab Results  Component Value Date   CHOL 203 (H) 06/16/2013   Lab Results  Component Value Date   HDL 54 06/16/2013   Lab Results  Component Value Date   LDLCALC 139 (H) 06/16/2013   Lab Results  Component Value Date   TRIG 51  06/16/2013   Lab Results  Component Value Date   CHOLHDL 3.8 06/16/2013   No results  found for: HGBA1C     Assessment & Plan:   Problem List Items Addressed This Visit    None    Visit Diagnoses    Lightheaded    -  Primary   Relevant Orders   ECHOCARDIOGRAM COMPLETE   Need for immunization against influenza       Relevant Orders   Flu Vaccine QUAD 36+ mos IM (Completed)   Postural dizziness with near syncope       Relevant Orders   ECHOCARDIOGRAM COMPLETE     Lightheadedness-discussed potential causes including dehydration and, to a quick change in position.  Everything sounds reassuring for the most part.  Labs are normal which rules out thyroid disease, anemia, electrolyte disturbance, renal failure.  We did discuss possibly getting an echocardiogram as part of the work-up as well just to make sure the mechanical part of the heart is working well given that he has not had any true syncope which is good.  We will go ahead and get that ordered he does exercise regularly which is great.  He does have a family history of heart failure though I am not sure what the underlying cause was  No orders of the defined types were placed in this encounter.    Beatrice Lecher, MD

## 2018-12-09 ENCOUNTER — Other Ambulatory Visit (HOSPITAL_BASED_OUTPATIENT_CLINIC_OR_DEPARTMENT_OTHER): Payer: Managed Care, Other (non HMO)

## 2018-12-11 ENCOUNTER — Ambulatory Visit (HOSPITAL_BASED_OUTPATIENT_CLINIC_OR_DEPARTMENT_OTHER)
Admission: RE | Admit: 2018-12-11 | Discharge: 2018-12-11 | Disposition: A | Discharge status: Home / Self Care | Payer: Managed Care, Other (non HMO) | Source: Ambulatory Visit | Attending: Family Medicine | Admitting: Family Medicine

## 2018-12-11 ENCOUNTER — Other Ambulatory Visit: Payer: Self-pay

## 2018-12-11 DIAGNOSIS — R55 Syncope and collapse: Secondary | ICD-10-CM | POA: Insufficient documentation

## 2018-12-11 DIAGNOSIS — R42 Dizziness and giddiness: Secondary | ICD-10-CM | POA: Insufficient documentation

## 2018-12-11 NOTE — Progress Notes (Signed)
  Echocardiogram 2D Echocardiogram has been performed.  Scott Ramirez 12/11/2018, 8:47 AM

## 2020-06-08 ENCOUNTER — Encounter: Payer: Self-pay | Admitting: Medical-Surgical

## 2020-06-08 ENCOUNTER — Ambulatory Visit (INDEPENDENT_AMBULATORY_CARE_PROVIDER_SITE_OTHER): Payer: BC Managed Care – PPO | Admitting: Medical-Surgical

## 2020-06-08 ENCOUNTER — Other Ambulatory Visit: Payer: Self-pay

## 2020-06-08 VITALS — BP 138/72 | HR 69 | Temp 98.1°F | Ht 67.0 in | Wt 156.5 lb

## 2020-06-08 DIAGNOSIS — L255 Unspecified contact dermatitis due to plants, except food: Secondary | ICD-10-CM

## 2020-06-08 MED ORDER — TRIAMCINOLONE ACETONIDE 0.5 % EX CREA: 1.0000 "application " | TOPICAL_CREAM | Freq: Two times a day (BID) | CUTANEOUS | 3 refills | Status: DC

## 2020-06-08 MED ORDER — PREDNISONE 10 MG (48) PO TBPK: ORAL_TABLET | Freq: Every day | ORAL | 0 refills | Status: DC

## 2020-06-08 MED ORDER — DOXYCYCLINE HYCLATE 100 MG PO TABS: 100.0000 mg | ORAL_TABLET | Freq: Two times a day (BID) | ORAL | 0 refills | Status: AC

## 2020-06-08 NOTE — Progress Notes (Signed)
Subjective:    CC: poison ivy/oak  HPI: Pleasant 60 year old male presenting for evaluation of a rash after being exposed to poison ivy/oak about 4 days ago. He was working [place], clearing out vines/brambles. Was wearing boots but thinks that the vines got wrapped around his leg and he got dust from the chainsaw in the top of the boot. The rash initially started on the right anterior lower leg but spots have come up on his forearms and the back of his left calf. He has tried not to scratch but admits that he has scratched the right shin a few times. Has had some drainage from the unroofed vesicles on the right shin. Used topical benadryl which was helpful. Washing the rash daily but got worried when he noticed his right ankle/shin was swollen. No fevers, chills, or leg pain.   I reviewed the past medical history, family history, social history, surgical history, and allergies today and no changes were needed.  Please see the problem list section below in epic for further details.  Past Medical History: History reviewed. No pertinent past medical history. Past Surgical History: Past Surgical History:  Procedure Laterality Date  . bone cyst removed     right femur fracture.   Social History: Social History   Socioeconomic History  . Marital status: Married    Spouse name: Ailene Ravel  . Number of children: 2  . Years of education: Not on file  . Highest education level: Not on file  Occupational History  . Occupation: Barista     Comment: [employer]  . Smoking status: Never Smoker  . Smokeless tobacco: Never Used  Substance and Sexual Activity  . Alcohol use: Yes    Alcohol/week: 4.0 standard drinks    Types: 4 Standard drinks or equivalent per week  . Drug use: No  . Sexual activity: Yes    Partners: Female  Other Topics Concern  . Not on file  Social History Narrative   Runs and cycles 4 x a week.    Social Determinants of Health    Financial Resource Strain: Not on file  Food Insecurity: Not on file  Transportation Needs: Not on file  Physical Activity: Not on file  Stress: Not on file  Social Connections: Not on file   Family History: Family History  Problem Relation Age of Onset  . Heart failure Mother   . Rheum arthritis Mother    Allergies: Allergies  Allergen Reactions  . Penicillins Hives  . Sulfonamide Derivatives Hives   Medications: See med rec.  Review of Systems: See HPI for pertinent positives and negatives.   Objective:    General: Well Developed, well nourished, and in no acute distress.  Neuro: Alert and oriented x3.  HEENT: Normocephalic, atraumatic.  Skin: Warm and dry. Scattered vesicular rash to the inner forearms and posterior left calf. Vesicular rash on erythematous base with mild edema and excoriations on the right shin down to the ankle area, several unroofed vesicles with serous drainage, several intact vesicles near the ankle.  Cardiac: Regular rate and rhythm, + lower extremity edema on the right ankle and lower right shin. Respiratory:  Not using accessory muscles, speaking in full sentences.   Impression and Recommendations:    1. Dermatitis due to plants, including poison ivy, sumac, and oak Start prednisone 12 day taper. Sending Triamcinolone cream for itching. Questionable early cellulitis. Since patient will be traveling out of town for the  weekend, sending in Doxycycline BID x 7 days. If no improvement with 48 hours  of steroids, start the antibiotic. Continue washing the area daily.   Return if symptoms worsen or fail to improve. ___________________________________________ Clearnce Sorrel, DNP, APRN, FNP-BC [facility] [place]

## 2020-08-04 ENCOUNTER — Ambulatory Visit (INDEPENDENT_AMBULATORY_CARE_PROVIDER_SITE_OTHER): Payer: BC Managed Care – PPO | Admitting: Family Medicine

## 2020-08-04 ENCOUNTER — Encounter: Payer: Self-pay | Admitting: Family Medicine

## 2020-08-04 ENCOUNTER — Other Ambulatory Visit: Payer: Self-pay

## 2020-08-04 VITALS — BP 124/75 | HR 69 | Ht 67.0 in | Wt 153.0 lb

## 2020-08-04 DIAGNOSIS — Z13220 Encounter for screening for lipoid disorders: Secondary | ICD-10-CM

## 2020-08-04 DIAGNOSIS — Z0000 Encounter for general adult medical examination without abnormal findings: Secondary | ICD-10-CM | POA: Diagnosis not present

## 2020-08-04 DIAGNOSIS — Z125 Encounter for screening for malignant neoplasm of prostate: Secondary | ICD-10-CM

## 2020-08-04 DIAGNOSIS — Z1329 Encounter for screening for other suspected endocrine disorder: Secondary | ICD-10-CM

## 2020-08-04 DIAGNOSIS — Z131 Encounter for screening for diabetes mellitus: Secondary | ICD-10-CM | POA: Diagnosis not present

## 2020-08-04 DIAGNOSIS — Z23 Encounter for immunization: Secondary | ICD-10-CM

## 2020-08-04 LAB — CBC WITH DIFFERENTIAL/PLATELET
Absolute Monocytes: 512 cells/uL (ref 200–950)
Basophils Absolute: 52 cells/uL (ref 0–200)
Basophils Relative: 1.1 %
Eosinophils Absolute: 113 cells/uL (ref 15–500)
Eosinophils Relative: 2.4 %
HCT: 46.4 % (ref 38.5–50.0)
Hemoglobin: 15.9 g/dL (ref 13.2–17.1)
Lymphs Abs: 1391 cells/uL (ref 850–3900)
MCH: 30.8 pg (ref 27.0–33.0)
MCHC: 34.3 g/dL (ref 32.0–36.0)
MCV: 89.7 fL (ref 80.0–100.0)
MPV: 11 fL (ref 7.5–12.5)
Monocytes Relative: 10.9 %
Neutro Abs: 2632 cells/uL (ref 1500–7800)
Neutrophils Relative %: 56 %
Platelets: 200 10*3/uL (ref 140–400)
RBC: 5.17 10*6/uL (ref 4.20–5.80)
RDW: 12.3 % (ref 11.0–15.0)
Total Lymphocyte: 29.6 %
WBC: 4.7 10*3/uL (ref 3.8–10.8)

## 2020-08-04 LAB — COMPLETE METABOLIC PANEL WITH GFR
AG Ratio: 2.2 (calc) (ref 1.0–2.5)
ALT: 15 U/L (ref 9–46)
AST: 23 U/L (ref 10–35)
Albumin: 4.4 g/dL (ref 3.6–5.1)
Alkaline phosphatase (APISO): 64 U/L (ref 35–144)
BUN: 14 mg/dL (ref 7–25)
CO2: 27 mmol/L (ref 20–32)
Calcium: 9 mg/dL (ref 8.6–10.3)
Chloride: 105 mmol/L (ref 98–110)
Creat: 0.95 mg/dL (ref 0.70–1.33)
GFR, Est African American: 102 mL/min/{1.73_m2} (ref >=60)
GFR, Est Non African American: 88 mL/min/{1.73_m2} (ref >=60)
Globulin: 2 g/dL (calc) (ref 1.9–3.7)
Glucose, Bld: 88 mg/dL (ref 65–99)
Potassium: 4.5 mmol/L (ref 3.5–5.3)
Sodium: 139 mmol/L (ref 135–146)
Total Bilirubin: 1.1 mg/dL (ref 0.2–1.2)
Total Protein: 6.4 g/dL (ref 6.1–8.1)

## 2020-08-04 LAB — LIPID PANEL W/REFLEX DIRECT LDL
Cholesterol: 188 mg/dL (ref <200)
HDL: 56 mg/dL (ref >=40)
LDL Cholesterol (Calc): 118 mg/dL (calc) — ABNORMAL HIGH
Non-HDL Cholesterol (Calc): 132 mg/dL (calc) — ABNORMAL HIGH (ref <130)
Total CHOL/HDL Ratio: 3.4 (calc) (ref <5.0)
Triglycerides: 46 mg/dL (ref <150)

## 2020-08-04 LAB — PSA: PSA: 0.45 ng/mL (ref <=4.00)

## 2020-08-04 LAB — TSH: TSH: 0.62 mIU/L (ref 0.40–4.50)

## 2020-08-04 NOTE — Progress Notes (Signed)
Patient ID: Scott Ramirez, male   DOB: 1960/09/17, 60 y.o.   MRN: [id number]    CPE Established Patient Office Visit  Subjective:  Patient ID: Scott Ramirez, male    DOB: April 18, 1960  Age: 60 y.o. MRN: [id number]  CC:  Chief Complaint  Patient presents with   Annual Exam    HPI Scott Ramirez presents for CPE.  Exercises regularly doing cardio and running.  No recent chest pain or shortness of breath with activities.  No GI symptoms.  His sleep quality has been good and mood has been good as well.  He has never had the shingles vaccine.  History reviewed. No pertinent past medical history.  Past Surgical History:  Procedure Laterality Date   bone cyst removed     right femur fracture.    Family History  Problem Relation Age of Onset   Heart failure Mother    Rheum arthritis Mother     Social History   Socioeconomic History   Marital status: Married    Spouse name: Scott Ramirez   Number of children: 2   Years of education: Not on file   Highest education level: Not on file  Occupational History   Occupation: Barista     Comment: [employer]  Tobacco Use   Smoking status: Never   Smokeless tobacco: Never  Substance and Sexual Activity   Alcohol use: Yes    Alcohol/week: 4.0 standard drinks    Types: 4 Standard drinks or equivalent per week   Drug use: No   Sexual activity: Yes    Partners: Female  Other Topics Concern   Not on file  Social History Narrative   Runs and cycles 4 x a week.    Social Determinants of Health   Financial Resource Strain: Not on file  Food Insecurity: Not on file  Transportation Needs: Not on file  Physical Activity: Not on file  Stress: Not on file  Social Connections: Not on file  Intimate Partner Violence: Not on file    Outpatient Medications Prior to Visit  Medication Sig Dispense Refill   Omega-3 Fatty Acids (FISH OIL PO) Take 1 capsule by mouth daily.     triamcinolone cream (KENALOG) 0.5 % Apply 1 application  topically 2 (two) times daily. To affected areas. 30 g 3   predniSONE (STERAPRED UNI-PAK 48 TAB) 10 MG (48) TBPK tablet Take by mouth daily. 12-Day taper, po 48 tablet 0   No facility-administered medications prior to visit.    Allergies  Allergen Reactions   Penicillins Hives   Sulfonamide Derivatives Hives     ROS Review of Systems    Objective:    Physical Exam Constitutional:      Appearance: He is well-developed.  HENT:     Head: Normocephalic and atraumatic.     Right Ear: External ear normal.     Left Ear: External ear normal.     Nose: Nose normal.  Eyes:     Conjunctiva/sclera: Conjunctivae normal.     Pupils: Pupils are equal, round, and reactive to light.  Neck:     Thyroid: No thyromegaly.  Cardiovascular:     Rate and Rhythm: Normal rate and regular rhythm.     Heart sounds: Normal heart sounds.  Pulmonary:     Effort: Pulmonary effort is normal.     Breath sounds: Normal breath sounds.  Abdominal:     General: Bowel sounds are normal. There is no distension.  Palpations: Abdomen is soft. There is no mass.     Tenderness: There is no abdominal tenderness. There is no guarding or rebound.  Musculoskeletal:        General: Normal range of motion.     Cervical back: Normal range of motion and neck supple.  Lymphadenopathy:     Cervical: No cervical adenopathy.  Skin:    General: Skin is warm and dry.  Neurological:     Mental Status: He is alert and oriented to person, place, and time.     Deep Tendon Reflexes: Reflexes are normal and symmetric.  Psychiatric:        Behavior: Behavior normal.        Thought Content: Thought content normal.        Judgment: Judgment normal.    BP 124/75   Pulse 69   Ht 5\' 7"  (1.702 m)   Wt 153 lb (69.4 kg)   SpO2 99%   BMI 23.96 kg/m  Wt Readings from Last 3 Encounters:  08/04/20 153 lb (69.4 kg)  06/08/20 156 lb 8 oz (71 kg)  11/28/18 154 lb (69.9 kg)     Health Maintenance Due  Topic Date Due    COVID-19 Vaccine (1) Never done   HIV Screening  Never done    There are no preventive care reminders to display for this patient.  Lab Results  Component Value Date   TSH 1.11 10/06/2018   Lab Results  Component Value Date   WBC 5.3 10/06/2018   HGB 16.6 10/06/2018   HCT 47 10/06/2018   MCV 88 09/02/2007   PLT 211 10/06/2018   Lab Results  Component Value Date   NA 138 10/06/2018   K 4.3 10/06/2018   CO2 21 10/06/2018   GLUCOSE 93 06/16/2013   BUN 13 10/06/2018   CREATININE 1.1 10/06/2018   BILITOT 1.2 06/16/2013   ALKPHOS 69 10/06/2018   AST 25 10/06/2018   ALT 20 10/06/2018   PROT 7.0 06/16/2013   ALBUMIN 4.7 06/16/2013   CALCIUM 9.2 06/16/2013   Lab Results  Component Value Date   CHOL 187 10/06/2018   Lab Results  Component Value Date   HDL 63 10/06/2018   Lab Results  Component Value Date   LDLCALC 113 10/06/2018   Lab Results  Component Value Date   TRIG 56 10/06/2018   Lab Results  Component Value Date   CHOLHDL 3.8 06/16/2013   No results found for: HGBA1C    Assessment & Plan:   Problem List Items Addressed This Visit   None Visit Diagnoses     Routine physical examination    -  Primary   Relevant Orders   CBC with Differential/Platelet   COMPLETE METABOLIC PANEL WITH GFR   Lipid Panel w/reflex Direct LDL   TSH   PSA   Lipid screening       Relevant Orders   Lipid Panel w/reflex Direct LDL   Diabetes mellitus screening       Relevant Orders   COMPLETE METABOLIC PANEL WITH GFR   Thyroid disorder screen       Relevant Orders   TSH   Screening PSA (prostate specific antigen)       Relevant Orders   PSA      Keep up a regular exercise program and make sure you are eating a healthy diet Try to eat 4 servings of dairy a day, or if you are lactose intolerant take a calcium with vitamin D  daily.  Your vaccines are up to date.  For shingles vaccine given today. Labs ordered today. Open 1 year.    No orders of the defined  types were placed in this encounter.   Follow-up: Return in about 4 months (around 12/04/2020) for Nurse visit - 2nd shingles vaccine .    Beatrice Lecher, MD

## 2020-08-04 NOTE — Patient Instructions (Signed)

## 2020-11-29 ENCOUNTER — Encounter: Payer: Self-pay | Admitting: Physician Assistant

## 2020-11-29 ENCOUNTER — Other Ambulatory Visit: Payer: Self-pay

## 2020-11-29 ENCOUNTER — Ambulatory Visit: Payer: BC Managed Care – PPO | Admitting: Physician Assistant

## 2020-11-29 VITALS — Ht 67.0 in | Wt 151.0 lb

## 2020-11-29 DIAGNOSIS — R55 Syncope and collapse: Secondary | ICD-10-CM | POA: Diagnosis not present

## 2020-11-29 NOTE — Progress Notes (Signed)
Subjective:    Patient ID: Scott Ramirez, male    DOB: 09/13/1960, 60 y.o.   MRN: [id number]  HPI Patient is a 60 year old male who presents to the clinic after near syncopal episode worsening this morning when he woke and went to the bathroom.  He has never had anything like this before.  He woke up went to the bathroom to urinate and while he was urinating felt like the room was going black and he was going to pass out.  He sat down for a minute got back up and has felt fine.  He has had no other episodes today.  He denies any fever, chills, body aches, headaches, ear pain, sore throat, cough, weakness, chest pain, palpitations.  He has not done anything different over the past few days.  He has not eaten anything different.  He feels fine now. Not on any medications. No recent vaccinations.   .. 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     Objective:   Physical Exam Vitals reviewed.  Constitutional:      Appearance: Normal appearance. He is obese.  HENT:     Head: Normocephalic and atraumatic.     Right Ear: Tympanic membrane, ear canal and external ear normal. There is no impacted cerumen.     Left Ear: Tympanic membrane, ear canal and external ear normal. There is no impacted cerumen.     Nose: Nose normal.     Mouth/Throat:     Mouth: Mucous membranes are moist.     Pharynx: No oropharyngeal exudate or posterior oropharyngeal erythema.  Eyes:     General:        Right eye: No discharge.        Left eye: No discharge.     Extraocular Movements: Extraocular movements intact.     Conjunctiva/sclera: Conjunctivae normal.     Pupils:  Pupils are equal, round, and reactive to light.  Neck:     Vascular: No carotid bruit.  Cardiovascular:     Rate and Rhythm: Normal rate and regular rhythm.     Pulses: Normal pulses.     Heart sounds: Normal heart sounds.  Pulmonary:     Effort: Pulmonary effort is normal.     Breath sounds: Normal breath sounds.  Abdominal:     General: Bowel sounds are normal.     Palpations: Abdomen is soft.     Tenderness: There is no right CVA tenderness or left CVA tenderness.  Musculoskeletal:        General: No swelling or tenderness.     Cervical back: No rigidity or tenderness.     Right lower leg: No edema.     Left lower leg: No edema.  Lymphadenopathy:     Cervical: No cervical adenopathy.  Skin:    Findings: No rash.  Neurological:     General: No focal deficit present.     Mental Status: He is alert and oriented to person, place, and time.     Motor: No weakness.     Coordination: Coordination normal.     Gait: Gait normal.  Deep Tendon Reflexes: Reflexes normal.     Comments: Negative dix hallpike.   Psychiatric:        Mood and Affect: Mood normal.          Assessment & Plan:  [name] KitchenMarland KitchenMakaio was seen today for dizziness.  Diagnoses and all orders for this visit:  Near syncope -     EKG 12-Lead -     Novel Coronavirus, NAA ([facility])  Unclear etiology. Symptoms now resolved. No signs of vertigo. Concern that this was the first symptoms of more viral symptoms to come.  Tested for covid in office.(Flu swabs not available) Watch out for more viral symptoms and treat symptomatically.  BP does show orthostatic changes. HO given. Make sure drinking plenty of water. If continues to occur consider compression stockings. Pt is not on any medications to cause this.  EKG in office no concerns. NSR and NO ST changes. No arrhthymias.  Likely not hydrated enough and maybe battling a viral infection. The valsalva maneuver in bathroom caused BP to drop too low.  Continue to follow up  if occurring more or more symptoms.

## 2020-11-29 NOTE — Patient Instructions (Addendum)
Orthostatic Hypotension Blood pressure is a measurement of how strongly, or weakly, your circulating blood is pressing against the walls of your arteries. Orthostatic hypotension is a drop in blood pressure that can happen when you change positions, such as when you go from lying down to standing. Arteries are blood vessels that carry blood from your heart throughout your body. When blood pressure is too low, you may not get enough blood to your brain or to the rest of your organs. Orthostatic hypotension can cause light-headedness, sweating, rapid heartbeat, blurred vision, and fainting. These symptoms require further investigation into the cause. What are the causes? Orthostatic hypotension can be caused by many things, including: Sudden changes in posture, such as standing up quickly after you have been sitting or lying down. Loss of blood (anemia) or loss of body fluids (dehydration). Heart problems, neurologic problems, or hormone problems. Pregnancy. Aging. The risk for this condition increases as you get older. Severe infection (sepsis). Certain medicines, such as medicines for high blood pressure or medicines that make the body lose excess fluids (diuretics). What are the signs or symptoms? Symptoms of this condition may include: Weakness, light-headedness, or dizziness. Sweating. Blurred vision. Tiredness (fatigue). Rapid heartbeat. Fainting, in severe cases. How is this diagnosed? This condition is diagnosed based on: Your symptoms and medical history. Your blood pressure measurements. Your health care provider will check your blood pressure when you are: Lying down. Sitting. Standing. A blood pressure reading is recorded as two numbers, such as "120 over 80" (or 120/80). The first ("top") number is called the systolic pressure. It is a measure of the pressure in your arteries as your heart beats. The second ("bottom") number is called the diastolic pressure. It is a measure of  the pressure in your arteries when your heart relaxes between beats. Blood pressure is measured in a unit called mmHg. Healthy blood pressure for most adults is 120/80 mmHg. Orthostatic hypotension is defined as a 20 mmHg drop in systolic pressure or a 10 mmHg drop in diastolic pressure within 3 minutes of standing. Other information or tests that may be used to diagnose orthostatic hypotension include: Your other vital signs, such as your heart rate and temperature. Blood tests. An electrocardiogram (ECG) or echocardiogram. A Holter monitor. This is a device you wear that records your heart rhythm continuously, usually for 24-48 hours. Tilt table test. For this test, you will be safely secured to a table that moves you from a lying position to an upright position. Your heart rhythm and blood pressure will be monitored during the test. How is this treated? This condition may be treated by: Changing your diet. This may involve eating more salt (sodium) or drinking more water. Changing the dosage of certain medicines you are taking that might be lowering your blood pressure. Correcting the underlying reason for the orthostatic hypotension. Wearing compression stockings. Taking medicines to raise your blood pressure. Avoiding actions that trigger symptoms. Follow these instructions at home: Medicines Take over-the-counter and prescription medicines only as told by your health care provider. Follow instructions from your health care provider about changing the dosage of your current medicines, if this applies. Do not stop or adjust any of your medicines on your own. Eating and drinking  Drink enough fluid to keep your urine pale yellow. Eat extra salt only as directed. Do not add extra salt to your diet unless advised by your health care provider. Eat frequent, small meals. Avoid standing up suddenly after eating. General instructions  Get up slowly from lying down or sitting positions. This  gives your blood pressure a chance to adjust. Avoid hot showers and excessive heat as directed by your health care provider. Engage in regular physical activity as directed by your health care provider. If you have compression stockings, wear them as told. Keep all follow-up visits. This is important. Contact a health care provider if: You have a fever for more than 2-3 days. You feel more thirsty than usual. You feel dizzy or weak. Get help right away if: You have chest pain. You have a fast or irregular heartbeat. You become sweaty or feel light-headed. You feel short of breath. You faint. You have any symptoms of a stroke. "BE FAST" is an easy way to remember the main warning signs of a stroke: B - Balance. Signs are dizziness, sudden trouble walking, or loss of balance. E - Eyes. Signs are trouble seeing or a sudden change in vision. F - Face. Signs are sudden weakness or numbness of the face, or the face or eyelid drooping on one side. A - Arms. Signs are weakness or numbness in an arm. This happens suddenly and usually on one side of the body. S - Speech. Signs are sudden trouble speaking, slurred speech, or trouble understanding what people say. T - Time. Time to call emergency services. Write down what time symptoms started. You have other signs of a stroke, such as: A sudden, severe headache with no known cause. Nausea or vomiting. Seizure. These symptoms may represent a serious problem that is an emergency. Do not wait to see if the symptoms will go away. Get medical help right away. Call your local emergency services (911 in the [place] Do not drive yourself to the hospital. Summary Orthostatic hypotension is a sudden drop in blood pressure. It can cause light-headedness, sweating, rapid heartbeat, blurred vision, and fainting. Orthostatic hypotension can be diagnosed by having your blood pressure taken while lying down, sitting, and then standing. Treatment may involve  changing your diet, wearing compression stockings, sitting up slowly, adjusting your medicines, or correcting the underlying reason for the orthostatic hypotension. Get help right away if you have chest pain, a fast or irregular heartbeat, or symptoms of a stroke. This information is not intended to replace advice given to you by your health care provider. Make sure you discuss any questions you have with your health care provider. Document Revised: 04/28/2020 Document Reviewed: 04/28/2020 Elsevier Patient Education  [place] is when you suddenly get weak or dizzy, or you feel like you might pass out (faint). This may also be called presyncope. This is due to a lack of blood flow to the brain. During an episode of near-syncope, you may: Feel dizzy, weak, or light-headed. Feel sick to your stomach (nauseous). See all white or all black. See spots. Have cold, clammy skin. This condition is caused by a sudden decrease in blood flow to the brain. This decrease can result from various causes, but most of those causes are not dangerous. However, near-syncope may be a sign of a serious medical problem, so it is important to seek medical care. Follow these instructions at home: Medicines Take over-the-counter and prescription medicines only as told by your doctor. If you are taking blood pressure or heart medicine, get up slowly and spend many minutes getting ready to sit and then stand. This can help with dizziness. General instructions Be aware of any changes in your symptoms. Talk with your doctor about  your symptoms. You may need to have testing to find the cause of your near-syncope. If you start to feel like you might pass out, lie down right away. Raise (elevate) your feet above the level of your heart. Breathe deeply and steadily. Wait until all of the symptoms are gone. Have someone stay with you until you feel stable. Do not drive, use machinery, or play  sports until your doctor says it is okay. Drink enough fluid to keep your pee (urine) pale yellow. Keep all follow-up visits as told by your doctor. This is important. Get help right away if you: Have a seizure. Have pain in your: Chest. Belly (abdomen). Back. Faint once or more than once. Have a very bad headache. Are bleeding from your mouth or butt. Have black or tarry poop (stool). Have a very fast or uneven heartbeat (palpitations). Are mixed up (confused). Have trouble walking. Are very weak. Have trouble seeing. These symptoms may be an emergency. Do not wait to see if the symptoms will go away. Get medical help right away. Call your local emergency services (911 in the [place] Do not drive yourself to the hospital. Summary Near-syncope is when you suddenly get weak or dizzy, or you feel like you might pass out (faint). This condition is caused by a lack of blood flow to the brain. Near-syncope may be a sign of a serious medical problem, so it is important to seek medical care. This information is not intended to replace advice given to you by your health care provider. Make sure you discuss any questions you have with your health care provider. Document Revised: 06/06/2018 Document Reviewed: 01/01/2018 Elsevier Patient Education  2022 [employer].

## 2020-11-30 ENCOUNTER — Encounter: Payer: Self-pay | Admitting: Physician Assistant

## 2020-11-30 LAB — NOVEL CORONAVIRUS, NAA: SARS-CoV-2, NAA: NOT DETECTED

## 2020-11-30 LAB — SARS-COV-2, NAA 2 DAY TAT

## 2020-11-30 NOTE — Progress Notes (Signed)
Negative for covid

## 2020-12-05 ENCOUNTER — Ambulatory Visit (INDEPENDENT_AMBULATORY_CARE_PROVIDER_SITE_OTHER): Payer: BC Managed Care – PPO | Admitting: Family Medicine

## 2020-12-05 VITALS — BP 135/72 | HR 58 | Wt 157.0 lb

## 2020-12-05 DIAGNOSIS — Z23 Encounter for immunization: Secondary | ICD-10-CM | POA: Diagnosis not present

## 2020-12-05 NOTE — Progress Notes (Signed)
Agree with documentation as above.   Nonna Renninger, MD  

## 2020-12-05 NOTE — Progress Notes (Signed)
Pt here for Shingrix #2 vaccine.  Screening questionnaire reviewed, VIS provided to patient, and any/all patient questions answered.  Charyl Bigger, CMA

## 2021-04-12 ENCOUNTER — Telehealth: Payer: Self-pay | Admitting: Family Medicine

## 2021-04-12 DIAGNOSIS — Z1211 Encounter for screening for malignant neoplasm of colon: Secondary | ICD-10-CM

## 2021-04-12 NOTE — Telephone Encounter (Signed)
Please call Scott Ramirez and let him know that we did find his old colonoscopy report from 2013.  He is actually due next month.  The final report actually said that they wanted to see him back sooner.  I know that he moved since the scope was actually done so they might not have been able to contact him.  But I would like to get him back in for repeat colonoscopy as soon as he was able to.  If he is okay with [facility] we can use them again or if he has a preference to go somewhere else am happy to make a new referral just let me know.

## 2021-04-13 NOTE — Telephone Encounter (Signed)
Scott Ramirez advised of recommendations.

## 2021-07-06 LAB — HM COLONOSCOPY

## 2021-08-07 ENCOUNTER — Ambulatory Visit: Payer: BC Managed Care – PPO | Admitting: Physician Assistant

## 2021-08-07 ENCOUNTER — Encounter: Payer: Self-pay | Admitting: Physician Assistant

## 2021-08-07 VITALS — BP 138/80 | HR 75 | Resp 16 | Ht 67.0 in | Wt 153.0 lb

## 2021-08-07 DIAGNOSIS — I493 Ventricular premature depolarization: Secondary | ICD-10-CM | POA: Diagnosis not present

## 2021-08-07 DIAGNOSIS — R002 Palpitations: Secondary | ICD-10-CM | POA: Diagnosis not present

## 2021-08-07 NOTE — Patient Instructions (Signed)
Premature Atrial Contraction  A premature atrial contraction (PAC) is a kind of irregular heartbeat (arrhythmia). It happens when the heart beats too early and then pauses before beating again. The heart has four areas, or chambers. Normally, electrical signals spread across the heart and make all the chambers beat together. During a PAC, the upper chambers of the heart (atria) beat too early, before they have had time to fill with blood. The heartbeat pauses afterward so the heart can fill with blood for the next beat. Sometimes PAC can be a warning sign of another type of arrhythmia called atrial fibrillation. Atrial fibrillation may allow blood to pool in the atria and form clots. If a clot travels to the brain, it can cause a stroke. What are the causes? The cause of this condition is often unknown. Sometimes, this condition may be caused by heart disease or injury to the heart. What increases the risk? You are more likely to develop this condition if: You are a child. You are an adult who is 50 years of age or older. Episodes may be triggered by: Caffeine. Alcohol. Tobacco use. Stimulant drugs. Some medicines or supplements. Stress. Heart disease. What are the signs or symptoms? Symptoms of this condition include: A feeling that your heart skipped a beat. The first heartbeat after the "skipped" beat may feel more forceful. A feeling that your heart is fluttering. How is this diagnosed? This condition is diagnosed based on: Your symptoms. A physical exam. Your health care provider may listen to your heart. An electrocardiogram (ECG). This is a test that records the electrical impulses of the heart. An ambulatory cardiac monitor. This device records your heartbeats for 24 hours or more. You may also have: An echocardiogram to check for any heart conditions. This is a type of imaging test that uses sound waves (ultrasound) to make images of your heart. Blood tests. How is this  treated? Treatment depends on the frequency of your symptoms and other risk factors. Treatments may include: Medicines (beta-blockers). Catheter ablation. This is done to destroy the part of the heart tissue that sends abnormal signals. In some cases, treatment may not be needed for this condition. Follow these instructions at home: Lifestyle Do not use any products that contain nicotine or tobacco, such as cigarettes, e-cigarettes, and chewing tobacco. If you need help quitting, ask your health care provider. Exercise regularly. Ask your health care provider what type of exercise is safe for you. Find healthy ways to manage stress. Try to get at least 7-9 hours of sleep each night, or as much as recommended by your health care provider. Alcohol use Do not drink alcohol if: Your health care provider tells you not to drink. You are pregnant, may be pregnant, or are planning to become pregnant. Alcohol triggers your episodes. If you drink alcohol: Limit how much you use to: 0-1 drink a day for women. 0-2 drinks a day for men. Be aware of how much alcohol is in your drink. In the [place] one drink equals one 12 oz bottle of beer (355 mL), one 5 oz glass of wine (148 mL), or one 1 oz glass of hard liquor (44 mL). General instructions Take over-the-counter and prescription medicines only as told by your health care provider. If caffeine triggers episodes, do not eat, drink, or use anything with caffeine in it. Keep all follow-up visits as told by your health care provider. This is important. Contact a health care provider if: You feel your heart skipping beats.   Your heart skips beats and you feel dizzy, light-headed, or very tired. Get help right away if you have: Chest pain. Trouble breathing. Any symptoms of a stroke. "BE FAST" is an easy way to remember the main warning signs of a stroke. B - Balance. Signs are dizziness, sudden trouble walking, or loss of balance. E - Eyes. Signs are  trouble seeing or a sudden change in vision. F - Face. Signs are sudden weakness or numbness of the face, or the face or eyelid drooping on one side. A - Arms. Signs are weakness or numbness in an arm. This happens suddenly and usually on one side of the body. S - Speech. Signs are sudden trouble speaking, slurred speech, or trouble understanding what people say. T - Time. Time to call emergency services. Write down what time symptoms started. Other signs of stroke, such as: A sudden, severe headache with no known cause. Nausea or vomiting. Seizure. These symptoms may represent a serious problem that is an emergency. Do not wait to see if the symptoms will go away. Get medical help right away. Call your local emergency services (911 in the [place] Do not drive yourself to the hospital. Summary A premature atrial contraction (PAC) is a kind of irregular heartbeat (arrhythmia). It happens when the heart beats too early and then pauses before beating again. Treatment depends on your symptoms and whether you have other underlying heart conditions. Contact a health care provider if your heart skips beats and you feel dizzy, light-headed, or very tired. In some cases, this condition may lead to a stroke. "BE FAST" is an easy way to remember the warning signs of stroke. Get help right away if you have any of the "BE FAST" signs. This information is not intended to replace advice given to you by your health care provider. Make sure you discuss any questions you have with your health care provider. Document Revised: 07/12/2020 Document Reviewed: 07/12/2020 Elsevier Patient Education  2023 [employer]vier Inc.  

## 2021-08-07 NOTE — Progress Notes (Addendum)
Acute Office Visit  Subjective:     Patient ID: Scott Ramirez, male    DOB: 07/01/1960, 61 y.o.   MRN: [id number]  Chief Complaint  Patient presents with   Palpitations    Patient complains of palpitations more often for the past week. Patient denies any other symptoms.    Colonoscopy    Done 06/2021 at [facility], Release form faxed     HPI Patient is in today for worsening palpitations for the last week or so. He has had similar palpitations in the past just not this often. He is having them now every hour at some point. No dizziness, chest pain, headaches, SOB. He is not aware of anything that has changed. He has not added any medications or supplements. Denies any caffeine usage. Rarely drinks any alcohol but has a beer every now and then. He is very busy and admits to not drinking water like he should. He does exercise.   .. Active Ambulatory Problems    Diagnosis Date Noted   HYPOGONADISM 12/28/2009   FATIGUE 12/27/2009   TINEA CORPORIS 04/10/2010   CHEST PAIN, ATYPICAL 04/25/2010   Atopic dermatitis, mild 04/10/2016   Near syncope 11/29/2020   PAC 08/07/2021   Palpitations 08/07/2021   Resolved Ambulatory Problems    Diagnosis Date Noted   No Resolved Ambulatory Problems   No Additional Past Medical History      ROS See HPI.      Objective:    BP 138/80   Pulse 75   Resp 16   Ht '5\' 7"'$  (1.702 m)   Wt 153 lb (69.4 kg)   SpO2 99%   BMI 23.96 kg/m  BP Readings from Last 3 Encounters:  08/07/21 138/80  12/05/20 135/72  08/04/20 124/75   Wt Readings from Last 3 Encounters:  08/07/21 153 lb (69.4 kg)  12/05/20 157 lb (71.2 kg)  11/29/20 151 lb (68.5 kg)      Physical Exam Vitals reviewed.  Constitutional:      Appearance: Normal appearance.  HENT:     Head: Normocephalic.  Neck:     Vascular: No carotid bruit.  Cardiovascular:     Rate and Rhythm: Normal rate. Rhythm irregular.     Pulses: Normal pulses.     Comments: Rare irregular  beat Pulmonary:     Effort: Pulmonary effort is normal.     Breath sounds: Normal breath sounds.  Musculoskeletal:     Right lower leg: No edema.     Left lower leg: No edema.  Lymphadenopathy:     Cervical: No cervical adenopathy.  Neurological:     General: No focal deficit present.     Mental Status: He is alert and oriented to person, place, and time.  Psychiatric:        Mood and Affect: Mood normal.       EKG: sinus rhythm with PAC. No ST elevation or Depression.     Assessment & Plan:  Scott Ramirez KitchenMarland KitchenDevine was seen today for palpitations and colonoscopy.  Diagnoses and all orders for this visit:  PAC -     EKG 12-Lead -     TSH -     BASIC METABOLIC PANEL WITH GFR -     CBC with Differential/Platelet -     Magnesium  Palpitations -     TSH -     BASIC METABOLIC PANEL WITH GFR -     CBC with Differential/Platelet -     Magnesium   Discussed  PAC with HO given Reviewed EKG Will check CBC/Mag/BMP/TSH today No link found to medication or supplements Avoid excessive caffeine or alcohol Discussed making sure he is staying hydrated and sleeping well If no improvement consider zio patch monitoring and/or medication Follow up with PCP in 3-4 weeks.       Return in about 4 weeks (around 09/04/2021) for Follow up.  Scott Planas, PA-C

## 2021-08-07 NOTE — Addendum Note (Signed)
Addended by: Donella Stade on: 08/07/2021 03:16 PM   Modules accepted: Level of Service

## 2021-08-08 LAB — CBC WITH DIFFERENTIAL/PLATELET
Absolute Monocytes: 459 cells/uL (ref 200–950)
Basophils Absolute: 50 cells/uL (ref 0–200)
Basophils Relative: 0.9 %
Eosinophils Absolute: 112 cells/uL (ref 15–500)
Eosinophils Relative: 2 %
HCT: 48.8 % (ref 38.5–50.0)
Hemoglobin: 16.9 g/dL (ref 13.2–17.1)
Lymphs Abs: 1361 cells/uL (ref 850–3900)
MCH: 31 pg (ref 27.0–33.0)
MCHC: 34.6 g/dL (ref 32.0–36.0)
MCV: 89.5 fL (ref 80.0–100.0)
MPV: 11.2 fL (ref 7.5–12.5)
Monocytes Relative: 8.2 %
Neutro Abs: 3618 cells/uL (ref 1500–7800)
Neutrophils Relative %: 64.6 %
Platelets: 220 10*3/uL (ref 140–400)
RBC: 5.45 10*6/uL (ref 4.20–5.80)
RDW: 12.4 % (ref 11.0–15.0)
Total Lymphocyte: 24.3 %
WBC: 5.6 10*3/uL (ref 3.8–10.8)

## 2021-08-08 LAB — BASIC METABOLIC PANEL WITH GFR
BUN: 14 mg/dL (ref 7–25)
CO2: 29 mmol/L (ref 20–32)
Calcium: 9.4 mg/dL (ref 8.6–10.3)
Chloride: 104 mmol/L (ref 98–110)
Creat: 1 mg/dL (ref 0.70–1.30)
Glucose, Bld: 101 mg/dL — ABNORMAL HIGH (ref 65–99)
Potassium: 4.5 mmol/L (ref 3.5–5.3)
Sodium: 140 mmol/L (ref 135–146)
eGFR: 87 mL/min/{1.73_m2} (ref >=60)

## 2021-08-08 LAB — TSH: TSH: 1 mIU/L (ref 0.40–4.50)

## 2021-08-08 LAB — MAGNESIUM: Magnesium: 2.4 mg/dL (ref 1.5–2.5)

## 2021-08-08 NOTE — Progress Notes (Signed)
Kedron,   Kidney, potassium, sodium, thyroid, WBC, hemoglobin, magnesium all look great.  No concerns in lab.

## 2021-09-06 ENCOUNTER — Encounter: Payer: Self-pay | Admitting: Family Medicine

## 2021-09-06 ENCOUNTER — Ambulatory Visit: Payer: BC Managed Care – PPO | Admitting: Family Medicine

## 2021-09-06 VITALS — BP 125/63 | HR 64 | Ht 67.0 in | Wt 155.0 lb

## 2021-09-06 DIAGNOSIS — Z23 Encounter for immunization: Secondary | ICD-10-CM | POA: Diagnosis not present

## 2021-09-06 DIAGNOSIS — R002 Palpitations: Secondary | ICD-10-CM

## 2021-09-06 NOTE — Progress Notes (Signed)
   Established Patient Office Visit  Subjective   Patient ID: Scott Ramirez, male    DOB: September 09, 1960  Age: 61 y.o. MRN: [id number]  Chief Complaint  Patient presents with   Follow-up    palpitations     HPI  Here today for follow-up of palpitations.  After having had several weeks of recurrent and frequent palpitations he came in to be seen by one of my partners.  EKG and labs were all very reassuring.  No specific triggers uncovered such as medications or caffeine.  He has been resting little bit more and been on vacation and completely cut caffeine out.  He notices that they are not as frequent but he is still noticing them they are usually brief but can occur repetitively.  He does not feel lightheaded or dizzy with it.  No weakness.  He has not had any chest pain whatsoever and has been exercising regularly.    ROS    Objective:     BP 125/63   Pulse 64   Ht '5\' 7"'$  (1.702 m)   Wt 155 lb (70.3 kg)   SpO2 99%   BMI 24.28 kg/m    Physical Exam Constitutional:      Appearance: He is well-developed.  HENT:     Head: Normocephalic and atraumatic.  Cardiovascular:     Rate and Rhythm: Normal rate and regular rhythm.     Heart sounds: Normal heart sounds.  Pulmonary:     Effort: Pulmonary effort is normal.     Breath sounds: Normal breath sounds.  Skin:    General: Skin is warm and dry.  Neurological:     Mental Status: He is alert and oriented to person, place, and time.  Psychiatric:        Behavior: Behavior normal.      Results for orders placed or performed in visit on 09/06/21  HM COLONOSCOPY  Result Value Ref Range   HM Colonoscopy See Report (in chart) See Report (in chart), Patient Reported      The 10-year ASCVD risk score (Arnett DK, et al., 2019) is: 6.6%    Assessment & Plan:   Problem List Items Addressed This Visit       Other   Palpitations    Overall feeling better but still having intermittent palpitations not daily.  We discussed  doing a Zio patch at this point.  Normal echocardiogram in 2020.        Relevant Orders   LONG TERM MONITOR (3-14 DAYS)   Other Visit Diagnoses     Need for tetanus, diphtheria, and acellular pertussis (Tdap) vaccine in patient of adolescent age or older    -  Primary   Relevant Orders   Tdap vaccine greater than or equal to 7yo IM (Completed)       No follow-ups on file.    Beatrice Lecher, MD

## 2021-09-06 NOTE — Assessment & Plan Note (Signed)
Overall feeling better but still having intermittent palpitations not daily.  We discussed doing a Zio patch at this point.  Normal echocardiogram in 2020.

## 2022-01-27 ENCOUNTER — Other Ambulatory Visit: Payer: Self-pay

## 2022-01-27 ENCOUNTER — Ambulatory Visit
Admission: EM | Admit: 2022-01-27 | Discharge: 2022-01-27 | Disposition: A | Discharge status: Home / Self Care | Payer: BC Managed Care – PPO

## 2022-01-27 DIAGNOSIS — J029 Acute pharyngitis, unspecified: Secondary | ICD-10-CM

## 2022-01-27 MED ORDER — AZITHROMYCIN 250 MG PO TABS: 250.0000 mg | ORAL_TABLET | Freq: Every day | ORAL | 0 refills | Status: DC

## 2022-01-27 MED ORDER — PREDNISONE 50 MG PO TABS: ORAL_TABLET | ORAL | 0 refills | Status: DC

## 2022-01-27 NOTE — ED Triage Notes (Signed)
Pt presents to Urgent Care with c/o worsening sore throat x 3 days, also some nasal congestion/drainage. Afebrile. Has not done COVID test.

## 2022-01-27 NOTE — ED Provider Notes (Signed)
[facility]    CSN: [id number] Arrival date & time: 01/27/22  6761      History   Chief Complaint Chief Complaint  Patient presents with   Sore Throat   Nasal Congestion    HPI GUILFORD SHANNAHAN is a 61 y.o. male.   HPI  History reviewed. No pertinent past medical history.  Patient Active Problem List   Diagnosis Date Noted   [facility] 08/07/2021   Palpitations 08/07/2021   Near syncope 11/29/2020   Atopic dermatitis, mild 04/10/2016   CHEST PAIN, ATYPICAL 04/25/2010   TINEA CORPORIS 04/10/2010   HYPOGONADISM 12/28/2009   FATIGUE 12/27/2009    Past Surgical History:  Procedure Laterality Date   bone cyst removed     right femur fracture.       Home Medications    Prior to Admission medications   Medication Sig Start Date End Date Taking? Authorizing Provider  ibuprofen (ADVIL) 200 MG tablet Take 200 mg by mouth every 6 (six) hours as needed.   Yes [provider]  Omega-3 Fatty Acids (FISH OIL PO) Take 1 capsule by mouth daily.    [provider]    Family History Family History  Problem Relation Age of Onset   Heart failure Mother    Rheum arthritis Mother    Healthy Father     Social History Social History   Tobacco Use   Smoking status: Never   Smokeless tobacco: Never  Vaping Use   Vaping Use: Never used  Substance Use Topics   Alcohol use: Yes    Alcohol/week: 4.0 standard drinks of alcohol    Types: 4 Standard drinks or equivalent per week   Drug use: No     Allergies   Penicillins and Sulfonamide derivatives   Review of Systems Review of Systems  HENT:  Positive for congestion, sinus pressure and sore throat.   All other systems reviewed and are negative.    Physical Exam Triage Vital Signs ED Triage Vitals  Enc Vitals Group     BP 01/27/22 0830 121/75     Pulse Rate 01/27/22 0830 66     Resp 01/27/22 0830 20     Temp 01/27/22 0830 98.1 F (36.7 C)     Temp Source 01/27/22 0830 Oral     SpO2  01/27/22 0830 100 %     Weight 01/27/22 0827 150 lb (68 kg)     Height 01/27/22 0827 '5\' 8"'$  (1.727 m)     Head Circumference --      Peak Flow --      Pain Score 01/27/22 0826 3     Pain Loc --      Pain Edu? --      Excl. in [place]? --    No data found.  Updated Vital Signs BP 121/75 (BP Location: Left Arm)   Pulse 66   Temp 98.1 F (36.7 C) (Oral)   Resp 20   Ht '5\' 8"'$  (1.727 m)   Wt 150 lb (68 kg)   SpO2 100%   BMI 22.81 kg/m    Physical Exam Vitals and nursing note reviewed.  Constitutional:      Appearance: He is well-developed and normal weight.  HENT:     Head: Normocephalic and atraumatic.     Right Ear: Tympanic membrane, ear canal and external ear normal.     Left Ear: Tympanic membrane, ear canal and external ear normal.     Mouth/Throat:  Mouth: Mucous membranes are moist.     Pharynx: Oropharynx is clear. Uvula midline. Posterior oropharyngeal erythema and uvula swelling present.  Eyes:     Conjunctiva/sclera: Conjunctivae normal.     Pupils: Pupils are equal, round, and reactive to light.  Cardiovascular:     Rate and Rhythm: Normal rate and regular rhythm.     Heart sounds: Normal heart sounds. No murmur heard. Pulmonary:     Effort: Pulmonary effort is normal.     Breath sounds: Normal breath sounds. No wheezing, rhonchi or rales.  Musculoskeletal:     Cervical back: Normal range of motion and neck supple. No tenderness.  Lymphadenopathy:     Cervical: No cervical adenopathy.  Skin:    General: Skin is warm and dry.  Neurological:     General: No focal deficit present.     Mental Status: He is alert and oriented to person, place, and time.      UC Treatments / Results  Labs (all labs ordered are listed, but only abnormal results are displayed) Labs Reviewed - No data to display  EKG   Radiology No results found.  Procedures Procedures (including critical care time)  Medications Ordered in UC Medications - No data to  display  Initial Impression / Assessment and Plan / UC Course  I have reviewed the triage vital signs and the nursing notes.  Pertinent labs & imaging results that were available during my care of the patient were reviewed by me and considered in my medical decision making (see chart for details).     MDM: 1.  Pharyngitis, unspecified etiology-Rx'd Zithromax, prednisone. Advised patient to take medication as directed with food to completion.  Advised patient to take prednisone with Zithromax daily for the next 5 days.  Encouraged increase daily water intake to 64 ounces per day.  Advised if symptoms worsen and/or unresolved please follow-up with PCP or here for further evaluation. Final Clinical Impressions(s) / UC Diagnoses   Final diagnoses:  Pharyngitis, unspecified etiology   Discharge Instructions   None    ED Prescriptions   None    PDMP not reviewed this encounter.   Eliezer Lofts, [place]bird Smith 01/27/22 [phone]

## 2022-01-27 NOTE — Discharge Instructions (Addendum)
Advised patient to take medication as directed with food to completion.  Advised patient to take prednisone with Zithromax daily for the next 5 days.  Encouraged increase daily water intake to 64 ounces per day.  Advised if symptoms worsen and/or unresolved please follow-up with PCP or here for further evaluation.

## 2022-07-05 ENCOUNTER — Encounter: Payer: Self-pay | Admitting: Family Medicine

## 2022-07-05 ENCOUNTER — Ambulatory Visit: Payer: BC Managed Care – PPO | Admitting: Family Medicine

## 2022-07-05 VITALS — BP 134/66 | HR 62 | Ht 67.0 in | Wt 152.0 lb

## 2022-07-05 DIAGNOSIS — L570 Actinic keratosis: Secondary | ICD-10-CM

## 2022-07-05 NOTE — Progress Notes (Signed)
   Established Patient Office Visit  Subjective   Patient ID: Scott Ramirez, male    DOB: [birth year]  Age: 62 y.o. MRN: [id number]  Chief Complaint  Patient presents with   Nevus    HPI Check spots on face.  He says the one on right facial cheek hits with razor occ.  The other 2 areas are on his left ear.  They are hyperkeratotic has been trying to put just some over-the-counter hydrocortisone on them.  He says are not painful or itchy.  No prior history of skin cancer.  He did have a lot of sun exposure when he was younger he used to live in [place].  Now he tries to use his sunscreen and sun protection.      ROS    Objective:     BP 134/66   Pulse 62   Ht 5\' 7"  (1.702 m)   Wt 152 lb (68.9 kg)   SpO2 100%   BMI 23.81 kg/m     Physical Exam Skin:    Comments: On his right side of his face near the TMJ area there is a very small fine maybe 2 mm hyperkeratotic pink lesion.  On his left ear towards the bottom he has maybe 4-5 mm per keratotic lesion and on the upper pinna is cut to separate smaller, 2 to 3 mm lesions that are hyperkeratotic as well.      No results found for any visits on 07/05/22.     The 10-year ASCVD risk score (Arnett DK, et al., 2019) is: 8.1%    Assessment & Plan:   Problem List Items Addressed This Visit   None Visit Diagnoses     Actinic keratoses    -  Primary      Cryotherapy performed.  Patient tolerated well.  Follow-up wound care discussed.  Once the skin peels please apply Vaseline.  If there is any remnant of the lesions were happy to do a repeat freeze.  Cryotherapy Procedure Note  Pre-operative Diagnosis: Actinic keratosis  Post-operative Diagnosis: same   Locations: right jaw, near TMJ and left ear 2 locations   Indications: precancerous   Anesthesia: not required     Procedure Details  Patient informed of risks (permanent scarring, infection, light or dark discoloration, bleeding, infection, weakness, numbness and  recurrence of the lesion) and benefits of the procedure and verbal informed consent obtained.  The areas are treated with liquid nitrogen therapy, frozen until ice ball extended 1-2 mm beyond lesion, allowed to thaw, and treated again. The patient tolerated procedure well.  The patient was instructed on post-op care, warned that there may be blister formation, redness and pain. Recommend OTC analgesia as needed for pain.  Condition: Stable  Complications: none.  Plan: 1. Instructed to keep the area dry and covered for 24-48h and clean thereafter. 2. Warning signs of infection were reviewed.   3. Recommended that the patient use OTC acetaminophen as needed for pain.  4. Return PRN   No follow-ups on file.    Nani Gasser, MD

## 2023-07-25 ENCOUNTER — Encounter: Payer: Self-pay | Admitting: Family Medicine

## 2023-07-25 ENCOUNTER — Ambulatory Visit (INDEPENDENT_AMBULATORY_CARE_PROVIDER_SITE_OTHER): Payer: Self-pay | Admitting: Family Medicine

## 2023-07-25 VITALS — BP 130/67 | HR 73 | Ht 67.0 in | Wt 155.0 lb

## 2023-07-25 DIAGNOSIS — Z125 Encounter for screening for malignant neoplasm of prostate: Secondary | ICD-10-CM | POA: Diagnosis not present

## 2023-07-25 DIAGNOSIS — Z0000 Encounter for general adult medical examination without abnormal findings: Secondary | ICD-10-CM | POA: Diagnosis not present

## 2023-07-25 NOTE — Progress Notes (Signed)
 Complete physical exam  Patient: Scott Ramirez   DOB: 31-Aug-1960   63 y.o. Male  MRN: [id number]  Subjective:     Chief Complaint  Patient presents with   Annual Exam    Scott Ramirez is a 63 y.o. male who presents today for a complete physical exam. He reports consuming a general diet. Every active and exercises He generally feels well. He reports sleeping well. He does not have additional problems to discuss today.    Most recent fall risk assessment:    07/25/2023   10:15 AM  Fall Risk   Falls in the past year? 0  Number falls in past yr: 0  Injury with Fall? 0  Risk for fall due to : No Fall Risks  Follow up Falls evaluation completed     Most recent depression screenings:    07/25/2023   10:00 AM 07/05/2022    9:21 AM  PHQ 2/9 Scores  PHQ - 2 Score 0 0         Patient Care Team: Cydney Draft, MD as PCP - General   Outpatient Medications Prior to Visit  Medication Sig   ibuprofen (ADVIL) 200 MG tablet Take 200 mg by mouth every 6 (six) hours as needed.   Omega-3 Fatty Acids (FISH OIL PO) Take 1 capsule by mouth daily.   No facility-administered medications prior to visit.    ROS        Objective:     BP 130/67   Pulse 73   Ht 5\' 7"  (1.702 m)   Wt 155 lb (70.3 kg)   SpO2 100%   BMI 24.28 kg/m     Physical Exam Constitutional:      Appearance: Normal appearance.  HENT:     Head: Normocephalic and atraumatic.     Right Ear: Tympanic membrane, ear canal and external ear normal.     Left Ear: Tympanic membrane, ear canal and external ear normal.     Nose: Nose normal.     Mouth/Throat:     Pharynx: Oropharynx is clear.  Eyes:     Extraocular Movements: Extraocular movements intact.     Conjunctiva/sclera: Conjunctivae normal.     Pupils: Pupils are equal, round, and reactive to light.  Neck:     Thyroid: No thyromegaly.  Cardiovascular:     Rate and Rhythm: Normal rate and regular rhythm.  Pulmonary:     Effort: Pulmonary  effort is normal.     Breath sounds: Normal breath sounds.  Abdominal:     General: Bowel sounds are normal.     Palpations: Abdomen is soft.     Tenderness: There is no abdominal tenderness.  Musculoskeletal:        General: No swelling.     Cervical back: Neck supple.  Skin:    General: Skin is warm and dry.  Neurological:     Mental Status: He is oriented to person, place, and time.  Psychiatric:        Mood and Affect: Mood normal.        Behavior: Behavior normal.      No results found for any visits on 07/25/23.      Assessment & Plan:    Routine Health Maintenance and Physical Exam  Immunization History  Administered Date(s) Administered   Influenza Whole 12/27/2009   Influenza,inj,Quad PF,6+ Mos 11/28/2018   Influenza-Unspecified 12/15/2013   Tdap 03/01/2011, 09/06/2021   Zoster Recombinant(Shingrix) 08/04/2020, 12/05/2020    Health  Maintenance  Topic Date Due   HIV Screening  09/07/2023 (Originally 02/12/1976)   COVID-19 Vaccine (1) 09/23/2023 (Originally 02/13/1966)   INFLUENZA VACCINE  09/27/2023   Colonoscopy  07/07/2031   DTaP/Tdap/Td (3 - Td or Tdap) 09/07/2031   Hepatitis C Screening  Completed   Zoster Vaccines- Shingrix  Completed   HPV VACCINES  Aged Out   Meningococcal B Vaccine  Aged Out    Discussed health benefits of physical activity, and encouraged him to engage in regular exercise appropriate for his age and condition.  Problem List Items Addressed This Visit   None Visit Diagnoses       Wellness examination    -  Primary   Relevant Orders   PSA   CBC with Differential   CMP14+EGFR   Lipid Panel With LDL/HDL Ratio     Screening for malignant neoplasm of prostate       Relevant Orders   PSA       Keep up a regular exercise program and make sure you are eating a healthy diet Try to eat 4 servings of dairy a day, or if you are lactose intolerant take a calcium with vitamin D daily.  Your vaccines are up to date.   Return in  about 1 year (around 07/24/2024) for Wellness Exam.     Duaine German, MD

## 2023-07-25 NOTE — Patient Instructions (Signed)
Keep up a regular exercise program and make sure you are eating a healthy diet Try to eat 4 servings of dairy a day, or if you are lactose intolerant take a calcium with vitamin D daily.  Your vaccines are up to date.   

## 2023-07-26 ENCOUNTER — Ambulatory Visit: Payer: Self-pay | Admitting: Family Medicine

## 2023-07-26 LAB — CBC WITH DIFFERENTIAL/PLATELET
Basophils Absolute: 0.1 10*3/uL (ref 0.0–0.2)
Basos: 1 %
EOS (ABSOLUTE): 0.2 10*3/uL (ref 0.0–0.4)
Eos: 4 %
Hematocrit: 49 % (ref 37.5–51.0)
Hemoglobin: 16.4 g/dL (ref 13.0–17.7)
Immature Grans (Abs): 0 10*3/uL (ref 0.0–0.1)
Immature Granulocytes: 0 %
Lymphocytes Absolute: 1.7 10*3/uL (ref 0.7–3.1)
Lymphs: 33 %
MCH: 30.1 pg (ref 26.6–33.0)
MCHC: 33.5 g/dL (ref 31.5–35.7)
MCV: 90 fL (ref 79–97)
Monocytes Absolute: 0.5 10*3/uL (ref 0.1–0.9)
Monocytes: 9 %
Neutrophils Absolute: 2.8 10*3/uL (ref 1.4–7.0)
Neutrophils: 53 %
Platelets: 200 10*3/uL (ref 150–450)
RBC: 5.44 x10E6/uL (ref 4.14–5.80)
RDW: 12.3 % (ref 11.6–15.4)
WBC: 5.2 10*3/uL (ref 3.4–10.8)

## 2023-07-26 LAB — CMP14+EGFR
ALT: 21 IU/L (ref 0–44)
AST: 28 IU/L (ref 0–40)
Albumin: 4.7 g/dL (ref 3.9–4.9)
Alkaline Phosphatase: 78 IU/L (ref 44–121)
BUN/Creatinine Ratio: 15 (ref 10–24)
BUN: 15 mg/dL (ref 8–27)
Bilirubin Total: 0.9 mg/dL (ref 0.0–1.2)
CO2: 21 mmol/L (ref 20–29)
Calcium: 9.4 mg/dL (ref 8.6–10.2)
Chloride: 100 mmol/L (ref 96–106)
Creatinine, Ser: 1.02 mg/dL (ref 0.76–1.27)
Globulin, Total: 2.3 g/dL (ref 1.5–4.5)
Glucose: 104 mg/dL — ABNORMAL HIGH (ref 70–99)
Potassium: 4.6 mmol/L (ref 3.5–5.2)
Sodium: 137 mmol/L (ref 134–144)
Total Protein: 7 g/dL (ref 6.0–8.5)
eGFR: 84 mL/min/{1.73_m2} (ref >59)

## 2023-07-26 LAB — LIPID PANEL WITH LDL/HDL RATIO
Cholesterol, Total: 211 mg/dL — ABNORMAL HIGH (ref 100–199)
HDL: 61 mg/dL (ref >39)
LDL Chol Calc (NIH): 141 mg/dL — ABNORMAL HIGH (ref 0–99)
LDL/HDL Ratio: 2.3 ratio (ref 0.0–3.6)
Triglycerides: 51 mg/dL (ref 0–149)
VLDL Cholesterol Cal: 9 mg/dL (ref 5–40)

## 2023-07-26 LAB — PSA: Prostate Specific Ag, Serum: 0.5 ng/mL (ref 0.0–4.0)

## 2023-07-26 NOTE — Progress Notes (Signed)
 Hi Karmine, metabolic panel overall looks good.  Total cholesterol and LDL are elevated.  Your current cardiovascular risk score is around 8.8% and otherwise low risk individuals we do consider starting medication around 10%.  Just encouraged her to continue to work on healthy diet and regular exercise and he you do a great job.  So some of this may just be genetic.  Triglycerides and HDL look great.  Could still consider that calcium score of the coronary arteries if that something you think you might be interested in for further workup.  Prostate test is normal.  Blood counts normal no sign of anemia.   The 10-year ASCVD risk score (Arnett DK, et al., 2019) is: 8.8%   Values used to calculate the score:     Age: 63 years     Sex: Male     Is Non-Hispanic African American: No     Diabetic: No     Tobacco smoker: No     Systolic Blood Pressure: 130 mmHg     Is BP treated: No     HDL Cholesterol: 61 mg/dL     Total Cholesterol: 211 mg/dL
